# Patient Record
Sex: Male | Born: 1950 | Race: White | Hispanic: No | Smoking: Never smoker
Health system: Southern US, Academic
[De-identification: ages and names within clinical notes are randomized; demographics above are authoritative.]

## PROBLEM LIST (undated history)

## (undated) DIAGNOSIS — F329 Major depressive disorder, single episode, unspecified: Secondary | ICD-10-CM

## (undated) DIAGNOSIS — I10 Essential (primary) hypertension: Secondary | ICD-10-CM

## (undated) DIAGNOSIS — Z8719 Personal history of other diseases of the digestive system: Secondary | ICD-10-CM

## (undated) DIAGNOSIS — Z973 Presence of spectacles and contact lenses: Secondary | ICD-10-CM

## (undated) DIAGNOSIS — H409 Unspecified glaucoma: Secondary | ICD-10-CM

## (undated) DIAGNOSIS — K5790 Diverticulosis of intestine, part unspecified, without perforation or abscess without bleeding: Secondary | ICD-10-CM

## (undated) DIAGNOSIS — K219 Gastro-esophageal reflux disease without esophagitis: Secondary | ICD-10-CM

## (undated) DIAGNOSIS — M1990 Unspecified osteoarthritis, unspecified site: Secondary | ICD-10-CM

## (undated) DIAGNOSIS — R7303 Prediabetes: Secondary | ICD-10-CM

## (undated) DIAGNOSIS — C801 Malignant (primary) neoplasm, unspecified: Secondary | ICD-10-CM

## (undated) DIAGNOSIS — F32A Depression, unspecified: Secondary | ICD-10-CM

## (undated) DIAGNOSIS — E7800 Pure hypercholesterolemia, unspecified: Secondary | ICD-10-CM

## (undated) DIAGNOSIS — M75100 Unspecified rotator cuff tear or rupture of unspecified shoulder, not specified as traumatic: Secondary | ICD-10-CM

## (undated) DIAGNOSIS — R011 Cardiac murmur, unspecified: Secondary | ICD-10-CM

## (undated) DIAGNOSIS — F419 Anxiety disorder, unspecified: Secondary | ICD-10-CM

## (undated) HISTORY — PX: ELBOW SURGERY: SHX618

## (undated) HISTORY — PX: CARPAL TUNNEL RELEASE: SHX101

## (undated) HISTORY — PX: WISDOM TOOTH EXTRACTION: SHX21

## (undated) HISTORY — PX: ANKLE SURGERY: SHX546

## (undated) HISTORY — PX: COLONOSCOPY: SHX174

---

## 2015-05-04 ENCOUNTER — Other Ambulatory Visit: Payer: Self-pay | Admitting: Orthopedic Surgery

## 2015-05-04 DIAGNOSIS — M545 Low back pain: Secondary | ICD-10-CM

## 2015-05-04 DIAGNOSIS — M25511 Pain in right shoulder: Secondary | ICD-10-CM

## 2015-05-18 ENCOUNTER — Ambulatory Visit
Admission: RE | Admit: 2015-05-18 | Discharge: 2015-05-18 | Disposition: A | Discharge status: Home / Self Care | Payer: Commercial Managed Care - PPO | Source: Ambulatory Visit | Attending: Orthopedic Surgery | Admitting: Orthopedic Surgery

## 2015-05-18 ENCOUNTER — Ambulatory Visit
Admission: RE | Admit: 2015-05-18 | Discharge: 2015-05-18 | Disposition: A | Discharge status: Home / Self Care | Payer: Self-pay | Source: Ambulatory Visit | Attending: Orthopedic Surgery | Admitting: Orthopedic Surgery

## 2015-05-18 DIAGNOSIS — M545 Low back pain: Secondary | ICD-10-CM

## 2015-05-18 DIAGNOSIS — M25511 Pain in right shoulder: Secondary | ICD-10-CM

## 2015-05-18 MED ORDER — IOHEXOL 180 MG/ML  SOLN
15.0000 mL | Freq: Once | INTRAMUSCULAR | Status: AC | PRN
  Administered 2015-05-18: 15 mL via INTRA_ARTICULAR

## 2015-05-21 ENCOUNTER — Other Ambulatory Visit (HOSPITAL_COMMUNITY): Payer: Self-pay | Admitting: Orthopedic Surgery

## 2015-05-28 ENCOUNTER — Encounter (HOSPITAL_COMMUNITY): Payer: Self-pay

## 2015-05-28 ENCOUNTER — Encounter (HOSPITAL_COMMUNITY)
Admission: RE | Admit: 2015-05-28 | Discharge: 2015-05-28 | Disposition: A | Discharge status: Home / Self Care | Payer: Commercial Managed Care - PPO | Source: Ambulatory Visit | Attending: Orthopedic Surgery | Admitting: Orthopedic Surgery

## 2015-05-28 ENCOUNTER — Other Ambulatory Visit (HOSPITAL_COMMUNITY): Payer: Self-pay | Admitting: Orthopedic Surgery

## 2015-05-28 DIAGNOSIS — I10 Essential (primary) hypertension: Secondary | ICD-10-CM | POA: Diagnosis not present

## 2015-05-28 DIAGNOSIS — M75101 Unspecified rotator cuff tear or rupture of right shoulder, not specified as traumatic: Secondary | ICD-10-CM | POA: Diagnosis not present

## 2015-05-28 DIAGNOSIS — F17290 Nicotine dependence, other tobacco product, uncomplicated: Secondary | ICD-10-CM | POA: Diagnosis not present

## 2015-05-28 DIAGNOSIS — M7551 Bursitis of right shoulder: Secondary | ICD-10-CM | POA: Diagnosis not present

## 2015-05-28 HISTORY — DX: Pure hypercholesterolemia, unspecified: E78.00

## 2015-05-28 HISTORY — DX: Presence of spectacles and contact lenses: Z97.3

## 2015-05-28 HISTORY — DX: Personal history of other diseases of the digestive system: Z87.19

## 2015-05-28 HISTORY — DX: Diverticulosis of intestine, part unspecified, without perforation or abscess without bleeding: K57.90

## 2015-05-28 HISTORY — DX: Essential (primary) hypertension: I10

## 2015-05-28 HISTORY — DX: Unspecified rotator cuff tear or rupture of unspecified shoulder, not specified as traumatic: M75.100

## 2015-05-28 HISTORY — DX: Major depressive disorder, single episode, unspecified: F32.9

## 2015-05-28 HISTORY — DX: Cardiac murmur, unspecified: R01.1

## 2015-05-28 HISTORY — DX: Depression, unspecified: F32.A

## 2015-05-28 HISTORY — DX: Unspecified glaucoma: H40.9

## 2015-05-28 HISTORY — DX: Gastro-esophageal reflux disease without esophagitis: K21.9

## 2015-05-28 HISTORY — DX: Malignant (primary) neoplasm, unspecified: C80.1

## 2015-05-28 HISTORY — DX: Anxiety disorder, unspecified: F41.9

## 2015-05-28 HISTORY — DX: Unspecified osteoarthritis, unspecified site: M19.90

## 2015-05-28 LAB — COMPREHENSIVE METABOLIC PANEL WITH GFR
ALT: 38 U/L (ref 17–63)
AST: 27 U/L (ref 15–41)
Albumin: 4 g/dL (ref 3.5–5.0)
Alkaline Phosphatase: 55 U/L (ref 38–126)
Anion gap: 10 (ref 5–15)
BUN: 18 mg/dL (ref 6–20)
CO2: 26 mmol/L (ref 22–32)
Calcium: 9.8 mg/dL (ref 8.9–10.3)
Chloride: 99 mmol/L — ABNORMAL LOW (ref 101–111)
Creatinine, Ser: 1.08 mg/dL (ref 0.61–1.24)
GFR calc Af Amer: >60 mL/min
GFR calc non Af Amer: >60 mL/min
Glucose, Bld: 100 mg/dL — ABNORMAL HIGH (ref 65–99)
Potassium: 4.4 mmol/L (ref 3.5–5.1)
Sodium: 135 mmol/L (ref 135–145)
Total Bilirubin: 0.6 mg/dL (ref 0.3–1.2)
Total Protein: 7.1 g/dL (ref 6.5–8.1)

## 2015-05-28 LAB — CBC
HCT: 41.8 % (ref 39.0–52.0)
Hemoglobin: 14.5 g/dL (ref 13.0–17.0)
MCH: 31.6 pg (ref 26.0–34.0)
MCHC: 34.7 g/dL (ref 30.0–36.0)
MCV: 91.1 fL (ref 78.0–100.0)
Platelets: 272 10*3/uL (ref 150–400)
RBC: 4.59 MIL/uL (ref 4.22–5.81)
RDW: 12.9 % (ref 11.5–15.5)
WBC: 6.4 10*3/uL (ref 4.0–10.5)

## 2015-05-28 MED ORDER — CHLORHEXIDINE GLUCONATE 4 % EX LIQD: 60.0000 mL | Freq: Once | CUTANEOUS | Status: DC

## 2015-05-28 MED ORDER — CEFAZOLIN SODIUM-DEXTROSE 2-3 GM-% IV SOLR
2.0000 g | INTRAVENOUS | Status: AC
  Administered 2015-05-31: 2 g via INTRAVENOUS
  Filled 2015-05-28: qty 50

## 2015-05-28 NOTE — Progress Notes (Signed)
   05/28/15 1523  OBSTRUCTIVE SLEEP APNEA  Have you ever been diagnosed with sleep apnea through a sleep study? No  Do you snore loudly (loud enough to be heard through closed doors)?  1  Do you often feel tired, fatigued, or sleepy during the daytime (such as falling asleep during driving or talking to someone)? 0  Has anyone observed you stop breathing during your sleep? 0  Do you have, or are you being treated for high blood pressure? 1  BMI more than 35 kg/m2? 1  Age > 50 (1-yes) 1  Neck circumference greater than:Male 16 inches or larger, Male 17inches or larger? 1  Male Gender (Yes=1) 1  Obstructive Sleep Apnea Score 6

## 2015-05-28 NOTE — Progress Notes (Signed)
Pt denies SOB, chest pain, and being under the care of a cardiologist. Pt denies having an echo and cardiac cath but stated that a stress test was done in 2005 in [place] , [place]; pt cannot recall the name of the medical practice or the physician that completed the echo. Ebony Hail, PA, anesthesia, made aware that pt drinks 2 glasses of wine daily while consuming Celebrex; Ebony Hail, [place], advised that it is okay to draw a CMET. Requested EKG from Dr. Tilden Dome at [facility] since tracing is not displayed, only results of EKG.

## 2015-05-28 NOTE — H&P (Signed)
Dale Glover. is an 65 y.o. male.   Chief Complaint: Right shoulder pain HPI: Dale Glover is a 65 year old patient with right shoulder pain. Has had shoulder pain for several months benign October was reaching for showerhead felt a pop and tear and shoulders been having some pain and weakness since that time. He is failed nonoperative management. Reports pain with ADLs as well as significant weakness particularly when he trying to raise his arm up overhead. He denies any neck pain or radicular symptoms MRI scan has been performed and show review mildly retracted rotator cuff tear of the supraspinatus  Past Medical History  Diagnosis Date  . Depression   . Hypercholesterolemia   . Arthritis   . Rotator cuff tear     right  . Hypertension   . GERD (gastroesophageal reflux disease)   . H/O: GI bleed   . Glaucoma   . Wears glasses   . Heart murmur   . Anxiety   . Cancer ([place])     basal cell carcinoma of the skin  . Diverticulosis     Past Surgical History  Procedure Laterality Date  . Carpal tunnel release      right wrist  . Colonoscopy    . Wisdom tooth extraction      Family History  Problem Relation Age of Onset  . Hypertension Mother   . Glaucoma Mother   . Hypertension Father   . Glaucoma Father   . Hypertension Sister    Social History:  reports that he has been smoking Cigars.  He has never used smokeless tobacco. He reports that he drinks alcohol. He reports that he does not use illicit drugs.  Allergies:  Allergies  Allergen Reactions  . Loratadine Other (See Comments)    Unknown  . Statins Other (See Comments)    Muscle aches  . Erythromycin Rash    No prescriptions prior to admission    Results for orders placed or performed during the hospital encounter of 05/28/15 (from the past 48 hour(s))  CBC     Status: None   Collection Time: 05/28/15  3:46 PM  Result Value Ref Range   WBC 6.4 4.0 - 10.5 K/uL   RBC 4.59 4.22 - 5.81 MIL/uL   Hemoglobin  14.5 13.0 - 17.0 g/dL   HCT 41.8 39.0 - 52.0 %   MCV 91.1 78.0 - 100.0 fL   MCH 31.6 26.0 - 34.0 pg   MCHC 34.7 30.0 - 36.0 g/dL   RDW 12.9 11.5 - 15.5 %   Platelets 272 150 - 400 K/uL  Comprehensive metabolic panel     Status: Abnormal   Collection Time: 05/28/15  3:46 PM  Result Value Ref Range   Sodium 135 135 - 145 mmol/L   Potassium 4.4 3.5 - 5.1 mmol/L   Chloride 99 (L) 101 - 111 mmol/L   CO2 26 22 - 32 mmol/L   Glucose, Bld 100 (H) 65 - 99 mg/dL   BUN 18 6 - 20 mg/dL   Creatinine, Ser 1.08 0.61 - 1.24 mg/dL   Calcium 9.8 8.9 - 10.3 mg/dL   Total Protein 7.1 6.5 - 8.1 g/dL   Albumin 4.0 3.5 - 5.0 g/dL   AST 27 15 - 41 U/L   ALT 38 17 - 63 U/L   Alkaline Phosphatase 55 38 - 126 U/L   Total Bilirubin 0.6 0.3 - 1.2 mg/dL   GFR calc non Af Amer >60 >60 mL/min   GFR calc  Af Amer >60 >60 mL/min    Comment: (NOTE) The eGFR has been calculated using the CKD EPI equation. This calculation has not been validated in all clinical situations. eGFR's persistently <60 mL/min signify possible Chronic Kidney Disease.    Anion gap 10 5 - 15   No results found.  Review of Systems  Constitutional: Negative.   HENT: Negative.   Eyes: Negative.   Respiratory: Negative.   Cardiovascular: Negative.   Gastrointestinal: Negative.   Genitourinary: Negative.   Musculoskeletal: Positive for joint pain.  Skin: Negative.   Neurological: Negative.   Endo/Heme/Allergies: Negative.   Psychiatric/Behavioral: Negative.     There were no vitals taken for this visit. Physical Exam  Constitutional: He appears well-developed.  HENT:  Head: Normocephalic.  Eyes: Pupils are equal, round, and reactive to light.  Neck: Normal range of motion.  Cardiovascular: Normal rate.   Respiratory: Effort normal.  Neurological: He is alert.  Skin: Skin is warm.  Psychiatric: He has a normal mood and affect.   examination of the right shoulder demonstrates supraspinatus and infraspinatus weakness no  before meals joint tenderness direct palpation elbow of coarse grinding with passive range of motion of the shoulder positive impingement signs equivocal O'Brien's testing no other masses lymphadenopathy or skin changes noted in the right shoulder girdle region  Assessment/Plan Impression is right shoulder rotator cuff tear which is symptomatic in this 65 year old patient. He is failed course of conservative treatment. Symptoms and weakness are persisting and worsening slightly. He is a lot of functional disability with the arm. MRI scan does show retracted tear of the supraspinatus but only about 2 cm. There is no muscle atrophy in the supraspinatus muscle belly. Status of the biceps tendon and anchor is not definitely known but looks reasonably good on my review of the scan. He does have some widening of the before meals joint but no instability and no symptoms clinically in this area plan is for arthroscopy inspection of the biceps tendon subacromial decompression with preservation of the CA ligament mini open rotator cuff repair which if we can get a stable repair can we can start him on CPM machine postop day #1 area risk and benefits discussed with the patient including not limited to infection nerve vessel damage shoulder stiffness as well as fairly prolonged recovery. Anticipate outpatient procedure. Will use CPM machine postop. All questions answered  Anayansi Rundquist SCOTT 05/28/2015, 9:51 PM

## 2015-05-28 NOTE — Pre-Procedure Instructions (Signed)
Dale Glover.  05/28/2015      [facility] - [address], [place] [address] Phone: [phone] Fax: [phone]  [facility] [place], [place] - [place] [address] Phone: [phone] Fax: [phone]    Your procedure is scheduled on Monday, May 31, 2015  Report to [facility] Admitting at 5:30 A.M.  Call this number if you have problems the morning of surgery:  [phone]   Remember:  Do not eat food or drink liquids after midnight Sunday, May 30, 2015  Take these medicines the morning of surgery with A SIP OF WATER : omeprazole (PRILOSEC) ,  sertraline (ZOLOFT), dorzolamide-timolol (COSOPT) eye drops  Stop taking Aspirin, vitamins, and herbal medications, omega-3 acid ethyl esters (LOVAZA)  . Do not take any NSAIDs ie: Ibuprofen, Advil, Naproxen or any medication containing Aspirin such ascelecoxib (CELEBREX) ; stop now.  Do not wear jewelry, make-up or nail polish.  Do not wear lotions, powders, or perfumes.  You may not wear deodorant.  Do not shave 48 hours prior to surgery.  Men may shave face and neck.  Do not bring valuables to the hospital.  [facility] is not responsible for any belongings or valuables.  Contacts, dentures or bridgework may not be worn into surgery.  Leave your suitcase in the car.  After surgery it may be brought to your room.  For patients admitted to the hospital, discharge time will be determined by your treatment team.  Patients discharged the day of surgery will not be allowed to drive home.   Name and phone number of your driver:   Special instructions:  Special Instructions:Special Instructions: [facility] - Preparing for Surgery  Before surgery, you can play an important role.  Because skin is not sterile, your skin needs to be as free of germs as possible.  You can reduce the number  of germs on you skin by washing with CHG (chlorahexidine gluconate) soap before surgery.  CHG is an antiseptic cleaner which kills germs and bonds with the skin to continue killing germs even after washing.  Please DO NOT use if you have an allergy to CHG or antibacterial soaps.  If your skin becomes reddened/irritated stop using the CHG and inform your nurse when you arrive at Short Stay.  Do not shave (including legs and underarms) for at least 48 hours prior to the first CHG shower.  You may shave your face.  Please follow these instructions carefully:   1.  Shower with CHG Soap the night before surgery and the morning of Surgery.  2.  If you choose to wash your hair, wash your hair first as usual with your normal shampoo.  3.  After you shampoo, rinse your hair and body thoroughly to remove the Shampoo.  4.  Use CHG as you would any other liquid soap.  You can apply chg directly  to the skin and wash gently with scrungie or a clean washcloth.  5.  Apply the CHG Soap to your body ONLY FROM THE NECK DOWN.  Do not use on open wounds or open sores.  Avoid contact with your eyes, ears, mouth and genitals (private parts).  Wash genitals (private parts) with your normal soap.  6.  Wash thoroughly, paying special attention to the area where your surgery will be performed.  7.  Thoroughly  rinse your body with warm water from the neck down.  8.  DO NOT shower/wash with your normal soap after using and rinsing off the CHG Soap.  9.  Pat yourself dry with a clean towel.            10.  Wear clean pajamas.            11.  Place clean sheets on your bed the night of your first shower and do not sleep with pets.  Day of Surgery  Do not apply any lotions/deodorants the morning of surgery.  Please wear clean clothes to the hospital/surgery center.  Please read over the following fact sheets that you were given. Pain Booklet, Coughing and Deep Breathing and Surgical Site Infection Prevention

## 2015-05-30 NOTE — Anesthesia Preprocedure Evaluation (Addendum)
Anesthesia Evaluation  Patient identified by MRN, date of birth, ID band Patient awake    Reviewed: Allergy & Precautions, NPO status , Patient's Chart, lab work & pertinent test results  Airway Mallampati: III  TM Distance: >3 FB Neck ROM: Full    Dental  (+) Teeth Intact, Dental Advisory Given   Pulmonary Current Smoker,    Pulmonary exam normal breath sounds clear to auscultation       Cardiovascular Exercise Tolerance: Good hypertension, Pt. on medications (-) angina(-) CAD and (-) Past MI Normal cardiovascular exam Rhythm:Regular Rate:Normal     Neuro/Psych PSYCHIATRIC DISORDERS Anxiety Depression negative neurological ROS     GI/Hepatic Neg liver ROS, GERD  Medicated,  Endo/Other  Obesity   Renal/GU negative Renal ROS     Musculoskeletal  (+) Arthritis , Osteoarthritis,    Abdominal   Peds  Hematology negative hematology ROS (+)   Anesthesia Other Findings Day of surgery medications reviewed with the patient.  Reproductive/Obstetrics                           Anesthesia Physical Anesthesia Plan  ASA: II  Anesthesia Plan: General   Post-op Pain Management: GA combined w/ Regional for post-op pain   Induction: Intravenous  Airway Management Planned: Oral ETT  Additional Equipment:   Intra-op Plan:   Post-operative Plan: Extubation in OR  Informed Consent: I have reviewed the patients History and Physical, chart, labs and discussed the procedure including the risks, benefits and alternatives for the proposed anesthesia with the patient or authorized representative who has indicated his/her understanding and acceptance.   Dental advisory given  Plan Discussed with: CRNA  Anesthesia Plan Comments: (Risks/benefits of general anesthesia discussed with patient including risk of damage to teeth, lips, gum, and tongue, nausea/vomiting, allergic reactions to medications, and the  possibility of heart attack, stroke and death.  All patient questions answered.  Patient wishes to proceed. Discussed risks and benefits of interscalene block including failure, bleeding, infection, nerve damage, weakness, shortness of breath, pneumothorax. Questions answered. Patient consents to block.  GETA plus ISB)        Anesthesia Quick Evaluation

## 2015-05-31 ENCOUNTER — Ambulatory Visit (HOSPITAL_COMMUNITY): Payer: Commercial Managed Care - PPO | Admitting: Anesthesiology

## 2015-05-31 ENCOUNTER — Encounter (HOSPITAL_COMMUNITY): Payer: Self-pay | Admitting: *Deleted

## 2015-05-31 ENCOUNTER — Ambulatory Visit (HOSPITAL_COMMUNITY)
Admission: RE | Admit: 2015-05-31 | Discharge: 2015-05-31 | Disposition: A | Discharge status: Home / Self Care | Payer: Commercial Managed Care - PPO | Source: Ambulatory Visit | Attending: Orthopedic Surgery | Admitting: Orthopedic Surgery

## 2015-05-31 ENCOUNTER — Encounter (HOSPITAL_COMMUNITY)
Admission: RE | Disposition: A | Discharge status: Home / Self Care | Payer: Self-pay | Source: Ambulatory Visit | Attending: Orthopedic Surgery

## 2015-05-31 ENCOUNTER — Ambulatory Visit (HOSPITAL_COMMUNITY): Payer: Commercial Managed Care - PPO

## 2015-05-31 DIAGNOSIS — M7551 Bursitis of right shoulder: Secondary | ICD-10-CM | POA: Insufficient documentation

## 2015-05-31 DIAGNOSIS — M75101 Unspecified rotator cuff tear or rupture of right shoulder, not specified as traumatic: Secondary | ICD-10-CM | POA: Insufficient documentation

## 2015-05-31 DIAGNOSIS — Z419 Encounter for procedure for purposes other than remedying health state, unspecified: Secondary | ICD-10-CM

## 2015-05-31 DIAGNOSIS — I10 Essential (primary) hypertension: Secondary | ICD-10-CM | POA: Insufficient documentation

## 2015-05-31 DIAGNOSIS — F17290 Nicotine dependence, other tobacco product, uncomplicated: Secondary | ICD-10-CM | POA: Insufficient documentation

## 2015-05-31 HISTORY — PX: SHOULDER ARTHROSCOPY WITH OPEN ROTATOR CUFF REPAIR AND DISTAL CLAVICLE ACROMINECTOMY: SHX5683

## 2015-05-31 SURGERY — SHOULDER ARTHROSCOPY WITH OPEN ROTATOR CUFF REPAIR AND DISTAL CLAVICLE ACROMINECTOMY
Anesthesia: General | Site: Shoulder | Laterality: Right

## 2015-05-31 MED ORDER — PHENYLEPHRINE 40 MCG/ML (10ML) SYRINGE FOR IV PUSH (FOR BLOOD PRESSURE SUPPORT)
PREFILLED_SYRINGE | INTRAVENOUS | Status: AC
  Filled 2015-05-31: qty 20

## 2015-05-31 MED ORDER — EPINEPHRINE HCL 1 MG/ML IJ SOLN
INTRAMUSCULAR | Status: AC
  Filled 2015-05-31: qty 1

## 2015-05-31 MED ORDER — NEOSTIGMINE METHYLSULFATE 10 MG/10ML IV SOLN
INTRAVENOUS | Status: DC | PRN
  Administered 2015-05-31: 5 mg via INTRAVENOUS

## 2015-05-31 MED ORDER — METHOCARBAMOL 500 MG PO TABS: 500.0000 mg | ORAL_TABLET | Freq: Four times a day (QID) | ORAL | Status: DC

## 2015-05-31 MED ORDER — LACTATED RINGERS IV SOLN
INTRAVENOUS | Status: DC | PRN
  Administered 2015-05-31 (×2): via INTRAVENOUS

## 2015-05-31 MED ORDER — DEXAMETHASONE SODIUM PHOSPHATE 10 MG/ML IJ SOLN
INTRAMUSCULAR | Status: DC | PRN
  Administered 2015-05-31: 10 mg via INTRAVENOUS

## 2015-05-31 MED ORDER — EPINEPHRINE HCL 1 MG/ML IJ SOLN
INTRAMUSCULAR | Status: DC | PRN
  Administered 2015-05-31: .1 mL via INTRAMUSCULAR

## 2015-05-31 MED ORDER — ROCURONIUM BROMIDE 100 MG/10ML IV SOLN
INTRAVENOUS | Status: DC | PRN
  Administered 2015-05-31: 50 mg via INTRAVENOUS

## 2015-05-31 MED ORDER — FENTANYL CITRATE (PF) 100 MCG/2ML IJ SOLN: 25.0000 ug | INTRAMUSCULAR | Status: DC | PRN

## 2015-05-31 MED ORDER — PROPOFOL 10 MG/ML IV BOLUS
INTRAVENOUS | Status: AC
  Filled 2015-05-31: qty 20

## 2015-05-31 MED ORDER — LIDOCAINE HCL (CARDIAC) 20 MG/ML IV SOLN
INTRAVENOUS | Status: DC | PRN
  Administered 2015-05-31: 100 mg via INTRATRACHEAL

## 2015-05-31 MED ORDER — SODIUM CHLORIDE 0.9 % IJ SOLN
INTRAMUSCULAR | Status: DC | PRN
  Administered 2015-05-31 (×3): 10 mL

## 2015-05-31 MED ORDER — ARTIFICIAL TEARS OP OINT
TOPICAL_OINTMENT | OPHTHALMIC | Status: DC | PRN
  Administered 2015-05-31: 1 via OPHTHALMIC

## 2015-05-31 MED ORDER — MIDAZOLAM HCL 2 MG/2ML IJ SOLN
INTRAMUSCULAR | Status: AC
  Filled 2015-05-31: qty 2

## 2015-05-31 MED ORDER — ROCURONIUM BROMIDE 50 MG/5ML IV SOLN
INTRAVENOUS | Status: AC
  Filled 2015-05-31: qty 1

## 2015-05-31 MED ORDER — DEXAMETHASONE SODIUM PHOSPHATE 10 MG/ML IJ SOLN
INTRAMUSCULAR | Status: AC
  Filled 2015-05-31: qty 1

## 2015-05-31 MED ORDER — ARTIFICIAL TEARS OP OINT
TOPICAL_OINTMENT | OPHTHALMIC | Status: AC
  Filled 2015-05-31: qty 3.5

## 2015-05-31 MED ORDER — SODIUM CHLORIDE 0.9 % IR SOLN
Status: DC | PRN
  Administered 2015-05-31 (×2): 3000 mL

## 2015-05-31 MED ORDER — PROPOFOL 10 MG/ML IV BOLUS
INTRAVENOUS | Status: DC | PRN
  Administered 2015-05-31: 150 mg via INTRAVENOUS

## 2015-05-31 MED ORDER — EPHEDRINE SULFATE 50 MG/ML IJ SOLN
INTRAMUSCULAR | Status: AC
  Filled 2015-05-31: qty 1

## 2015-05-31 MED ORDER — BUPIVACAINE HCL (PF) 0.25 % IJ SOLN
INTRAMUSCULAR | Status: DC | PRN
  Administered 2015-05-31: 30 mL

## 2015-05-31 MED ORDER — 0.9 % SODIUM CHLORIDE (POUR BTL) OPTIME
TOPICAL | Status: DC | PRN
  Administered 2015-05-31: 1000 mL

## 2015-05-31 MED ORDER — FENTANYL CITRATE (PF) 250 MCG/5ML IJ SOLN
INTRAMUSCULAR | Status: DC | PRN
  Administered 2015-05-31 (×2): 50 ug via INTRAVENOUS
  Administered 2015-05-31: 100 ug via INTRAVENOUS
  Administered 2015-05-31: 50 ug via INTRAVENOUS

## 2015-05-31 MED ORDER — ONDANSETRON HCL 4 MG/2ML IJ SOLN
INTRAMUSCULAR | Status: DC | PRN
  Administered 2015-05-31: 4 mg via INTRAVENOUS

## 2015-05-31 MED ORDER — MIDAZOLAM HCL 2 MG/2ML IJ SOLN
INTRAMUSCULAR | Status: DC | PRN
  Administered 2015-05-31: 2 mg via INTRAVENOUS

## 2015-05-31 MED ORDER — GLYCOPYRROLATE 0.2 MG/ML IJ SOLN
INTRAMUSCULAR | Status: DC | PRN
  Administered 2015-05-31: .8 mg via INTRAVENOUS
  Administered 2015-05-31: 0.2 mg via INTRAVENOUS

## 2015-05-31 MED ORDER — ONDANSETRON HCL 4 MG/2ML IJ SOLN
INTRAMUSCULAR | Status: AC
  Filled 2015-05-31: qty 2

## 2015-05-31 MED ORDER — BUPIVACAINE HCL (PF) 0.25 % IJ SOLN
INTRAMUSCULAR | Status: AC
  Filled 2015-05-31: qty 30

## 2015-05-31 MED ORDER — HYDROCODONE-ACETAMINOPHEN 5-325 MG PO TABS: 1.0000 | ORAL_TABLET | Freq: Four times a day (QID) | ORAL | Status: DC | PRN

## 2015-05-31 MED ORDER — PHENYLEPHRINE HCL 10 MG/ML IJ SOLN
INTRAMUSCULAR | Status: DC | PRN
  Administered 2015-05-31: 120 ug via INTRAVENOUS
  Administered 2015-05-31 (×2): 160 ug via INTRAVENOUS
  Administered 2015-05-31: 120 ug via INTRAVENOUS
  Administered 2015-05-31: 80 ug via INTRAVENOUS

## 2015-05-31 MED ORDER — LIDOCAINE HCL (CARDIAC) 20 MG/ML IV SOLN
INTRAVENOUS | Status: AC
  Filled 2015-05-31: qty 5

## 2015-05-31 MED ORDER — BUPIVACAINE-EPINEPHRINE (PF) 0.5% -1:200000 IJ SOLN
INTRAMUSCULAR | Status: DC | PRN
  Administered 2015-05-31: 20 mL via PERINEURAL

## 2015-05-31 MED ORDER — PHENYLEPHRINE HCL 10 MG/ML IJ SOLN
10.0000 mg | INTRAMUSCULAR | Status: DC | PRN
  Administered 2015-05-31: 50 ug/min via INTRAVENOUS

## 2015-05-31 MED ORDER — ONDANSETRON HCL 4 MG/2ML IJ SOLN: 4.0000 mg | Freq: Once | INTRAMUSCULAR | Status: DC | PRN

## 2015-05-31 MED ORDER — METOPROLOL TARTRATE 1 MG/ML IV SOLN
INTRAVENOUS | Status: DC | PRN
  Administered 2015-05-31: 1 mg via INTRAVENOUS

## 2015-05-31 MED ORDER — FENTANYL CITRATE (PF) 250 MCG/5ML IJ SOLN
INTRAMUSCULAR | Status: AC
  Filled 2015-05-31: qty 5

## 2015-05-31 MED ORDER — EPHEDRINE SULFATE 50 MG/ML IJ SOLN
INTRAMUSCULAR | Status: DC | PRN
  Administered 2015-05-31: 15 mg via INTRAVENOUS
  Administered 2015-05-31 (×2): 5 mg via INTRAVENOUS
  Administered 2015-05-31: 10 mg via INTRAVENOUS
  Administered 2015-05-31: 15 mg via INTRAVENOUS

## 2015-05-31 MED ORDER — GLYCOPYRROLATE 0.2 MG/ML IJ SOLN
INTRAMUSCULAR | Status: AC
  Filled 2015-05-31: qty 1

## 2015-05-31 SURGICAL SUPPLY — 68 items
ANCHOR CORKSCREW BIO 5.5 FT (Anchor) ×6 IMPLANT
ANCHOR CORKSCREW BIO 6.5 W/SUT (Anchor) IMPLANT
BLADE CUTTER GATOR 3.5 (BLADE) ×3 IMPLANT
BLADE GREAT WHITE 4.2 (BLADE) ×2 IMPLANT
BLADE GREAT WHITE 4.2MM (BLADE) ×1
BLADE SURG 11 STRL SS (BLADE) ×3 IMPLANT
BUR OVAL 6.0 (BURR) ×3 IMPLANT
CLOSURE STERI-STRIP 1/2X4 (GAUZE/BANDAGES/DRESSINGS) ×1
CLOSURE WOUND 1/2 X4 (GAUZE/BANDAGES/DRESSINGS) ×1
CLSR STERI-STRIP ANTIMIC 1/2X4 (GAUZE/BANDAGES/DRESSINGS) ×2 IMPLANT
COVER SURGICAL LIGHT HANDLE (MISCELLANEOUS) ×3 IMPLANT
DRAPE INCISE IOBAN 66X45 STRL (DRAPES) ×6 IMPLANT
DRAPE STERI 35X30 U-POUCH (DRAPES) ×3 IMPLANT
DRAPE U-SHAPE 47X51 STRL (DRAPES) ×6 IMPLANT
DRSG MEPILEX BORDER 4X8 (GAUZE/BANDAGES/DRESSINGS) ×3 IMPLANT
DRSG PAD ABDOMINAL 8X10 ST (GAUZE/BANDAGES/DRESSINGS) ×9 IMPLANT
DURAPREP 26ML APPLICATOR (WOUND CARE) ×3 IMPLANT
ELECT REM PT RETURN 9FT ADLT (ELECTROSURGICAL) ×3
ELECTRODE REM PT RTRN 9FT ADLT (ELECTROSURGICAL) ×1 IMPLANT
FILTER STRAW FLUID ASPIR (MISCELLANEOUS) ×3 IMPLANT
GLOVE BIOGEL PI IND STRL 8 (GLOVE) ×1 IMPLANT
GLOVE BIOGEL PI INDICATOR 8 (GLOVE) ×2
GLOVE SURG ORTHO 8.0 STRL STRW (GLOVE) ×3 IMPLANT
GOWN STRL REUS W/ TWL LRG LVL3 (GOWN DISPOSABLE) ×3 IMPLANT
GOWN STRL REUS W/TWL LRG LVL3 (GOWN DISPOSABLE) ×6
KIT BASIN OR (CUSTOM PROCEDURE TRAY) ×3 IMPLANT
KIT ROOM TURNOVER OR (KITS) ×3 IMPLANT
MANIFOLD NEPTUNE II (INSTRUMENTS) ×3 IMPLANT
NDL SUT 6 .5 CRC .975X.05 MAYO (NEEDLE) ×1 IMPLANT
NEEDLE HYPO 25X1 1.5 SAFETY (NEEDLE) ×3 IMPLANT
NEEDLE MAYO TAPER (NEEDLE) ×2
NEEDLE SCORPION MULTI FIRE (NEEDLE) ×3 IMPLANT
NEEDLE SPNL 18GX3.5 QUINCKE PK (NEEDLE) ×3 IMPLANT
NS IRRIG 1000ML POUR BTL (IV SOLUTION) ×3 IMPLANT
PACK SHOULDER (CUSTOM PROCEDURE TRAY) ×3 IMPLANT
PAD ARMBOARD 7.5X6 YLW CONV (MISCELLANEOUS) ×6 IMPLANT
PUSHLOCK PEEK 4.5X24 (Orthopedic Implant) ×6 IMPLANT
RESTRAINT HEAD UNIVERSAL NS (MISCELLANEOUS) ×3 IMPLANT
SET ARTHROSCOPY TUBING (MISCELLANEOUS) ×2
SET ARTHROSCOPY TUBING LN (MISCELLANEOUS) ×1 IMPLANT
SLING ARM IMMOBILIZER LRG (SOFTGOODS) ×3 IMPLANT
SLING ARM IMMOBILIZER MED (SOFTGOODS) IMPLANT
SPONGE GAUZE 4X4 12PLY STER LF (GAUZE/BANDAGES/DRESSINGS) ×3 IMPLANT
SPONGE LAP 4X18 X RAY DECT (DISPOSABLE) ×6 IMPLANT
STRIP CLOSURE SKIN 1/2X4 (GAUZE/BANDAGES/DRESSINGS) ×2 IMPLANT
SUCTION FRAZIER TIP 10 FR DISP (SUCTIONS) ×3 IMPLANT
SUT ETHILON 3 0 PS 1 (SUTURE) ×3 IMPLANT
SUT FIBERWIRE #2 38 T-5 BLUE (SUTURE)
SUT FIBERWIRE 2-0 18 17.9 3/8 (SUTURE) ×21
SUT MNCRL AB 3-0 PS2 18 (SUTURE) ×3 IMPLANT
SUT VIC AB 0 CT1 27 (SUTURE) ×6
SUT VIC AB 0 CT1 27XBRD ANBCTR (SUTURE) ×3 IMPLANT
SUT VIC AB 1 CT1 27 (SUTURE) ×2
SUT VIC AB 1 CT1 27XBRD ANBCTR (SUTURE) ×1 IMPLANT
SUT VIC AB 2-0 CT1 27 (SUTURE) ×2
SUT VIC AB 2-0 CT1 TAPERPNT 27 (SUTURE) ×1 IMPLANT
SUT VIC AB 3-0 FS2 27 (SUTURE) ×3 IMPLANT
SUT VICRYL 0 UR6 27IN ABS (SUTURE) ×6 IMPLANT
SUTURE FIBERWR #2 38 T-5 BLUE (SUTURE) IMPLANT
SUTURE FIBERWR 2-0 18 17.9 3/8 (SUTURE) ×7 IMPLANT
SYR 20CC LL (SYRINGE) ×6 IMPLANT
SYR 3ML LL SCALE MARK (SYRINGE) ×3 IMPLANT
SYR TB 1ML LUER SLIP (SYRINGE) ×3 IMPLANT
TOWEL OR 17X24 6PK STRL BLUE (TOWEL DISPOSABLE) ×3 IMPLANT
TOWEL OR 17X26 10 PK STRL BLUE (TOWEL DISPOSABLE) ×3 IMPLANT
WAND HAND CNTRL MULTIVAC 90 (MISCELLANEOUS) ×3 IMPLANT
WATER STERILE IRR 1000ML POUR (IV SOLUTION) ×3 IMPLANT
YANKAUER SUCT BULB TIP NO VENT (SUCTIONS) ×3 IMPLANT

## 2015-05-31 NOTE — Interval H&P Note (Signed)
History and Physical Interval Note:  05/31/2015 7:27 AM  Dale Glover.  has presented today for surgery, with the diagnosis of RIGHT SHOULDER ROTATOR CUFF TEAR   The various methods of treatment have been discussed with the patient and family. After consideration of risks, benefits and other options for treatment, the patient has consented to  Procedure(s): RIGHT SHOULDER ARTHROSCOPY WITH MINI-OPEN ROTATOR CUFF REPAIR AND SUBACROMIAL DECOMPRESSION (Right) as a surgical intervention .  The patient's history has been reviewed, patient examined, no change in status, stable for surgery.  I have reviewed the patient's chart and labs.  Questions were answered to the patient's satisfaction.     Marvyn Torrez SCOTT

## 2015-05-31 NOTE — Anesthesia Postprocedure Evaluation (Signed)
Anesthesia Post Note  Patient: Dale Glover.  Procedure(s) Performed: Procedure(s) (LRB): RIGHT SHOULDER ARTHROSCOPY WITH MINI-OPEN ROTATOR CUFF REPAIR AND SUBACROMIAL DECOMPRESSION (Right)  Patient location during evaluation: PACU Anesthesia Type: General Level of consciousness: awake and alert Pain management: pain level controlled Vital Signs Assessment: post-procedure vital signs reviewed and stable Respiratory status: spontaneous breathing, nonlabored ventilation, respiratory function stable and patient connected to nasal cannula oxygen Cardiovascular status: blood pressure returned to baseline and stable Postop Assessment: no signs of nausea or vomiting Anesthetic complications: no    Last Vitals:  Filed Vitals:   05/31/15 1148 05/31/15 1203  BP: 131/80 135/82  Pulse: 79 78  Temp:    Resp: 12 15    Last Pain:  Filed Vitals:   05/31/15 1208  PainSc: 0-No pain                 [name],JAMES TERRILL

## 2015-05-31 NOTE — Op Note (Signed)
NAME:  Dale Glover, Dale Glover NO.:  [id number]  MEDICAL RECORD NO.:  [id number]  LOCATION:  [facility]                         FACILITY:  [facility]  PHYSICIAN:  Anderson Malta, M.D.    DATE OF BIRTH:  04-04-1951  DATE OF PROCEDURE: DATE OF DISCHARGE:  05/31/2015                              OPERATIVE REPORT   PREOPERATIVE DIAGNOSIS:  Right shoulder rotator cuff tear and bursitis.  POSTOPERATIVE DIAGNOSIS:  Right shoulder rotator cuff tear and bursitis.  PROCEDURE:  Right shoulder arthroscopy, subacromial decompression, mini open rotator cuff repair.  SURGEON:  Anderson Malta, M.D.  ASSISTANT:  Johnell Comings, RNFA  INDICATIONS:  Dale Glover is a 65 year old patient with right arm weakness, pain, rotator cuff tearing, presents for operative management after explanation of risks and benefits.  PROCEDURE IN DETAIL:  The patient was brought to the operating room where general endotracheal anesthesia was induced.  Preoperative antibiotics were administered.  Time-out was called.  The patient was placed in the beach-chair position with the head in neutral position. Right arm was prescribed with alcohol and bedtime and allowed to air dry, prepped with DuraPrep solution and draped in a sterile manner. Charlie Pitter was used to cover the axilla.  Examination under anesthesia demonstrated full forward flexion, abduction at 100 degrees, external rotation at 15 degrees with abduction on the right to about 60 degrees. Shoulder stability excellent, anterior-posterior stress.  Less than 1 cm sulcus sign.  Following examination under anesthesia, time-out was called.  Solution of saline with epinephrine was injected in the subacromial space.  Solution of saline only injected into the joint. Diagnostic arthroscopy was performed.  Posterior portal was created 2 cm medial and inferior to the posterolateral margin of the acromion. Diagnostic arthroscopy demonstrated stable biceps anchor with little  bit of early synovitis present within the rotator interval.  Biceps tendon itself was stable and intact.  The patient had a split-type tear of the infraspinatus and supraspinatus tendon, which was full thickness.  This was observed from the articular surface.  No real debridement was necessary inside the joint.  The scope was placed in the subacromial space.  Lateral portal created under direct visualization. Acromioplasty performed.  The split component of the tear was visualized.  At this time, instruments were removed.  Portals were closed using 3-0 nylon.  Charlie Pitter was used to cover the entire operative field.  Incision was made off the anterolateral margin of the acromion. Skin and subcutaneous tissue were sharply divided.  Deltoid was split and measured distance of 4 cm from the anterolateral margin of the acromion, marked with #1 Vicryl suture.  Self-retaining retractor was placed.  The rotator cuff tear was then visualized.  Essentially, there was bald spot just lateral to the supraspinatus attachment; however, about half of the supraspinatus attachment was intact anterior to this bald spot on the footprint.  What was present was delaminated tear of the infraspinatus, which had retracted posteriorly.  This was mobilized anteriorly and actually needed to be present in this bald spot on the rotator cuff attachment.  There was a split-type thickness tear in the posterior aspect of that part of the supraspinatus, which was intact.  The L-shape flap of infraspinatus and supraspinatus, which delaminated, fit into this bald spot.  This was mobilized with a Cobb.  At this time, using Scorpion suture passer, the anterior limb of the L-split was repaired using six 2-0 FiberWire sutures.  Grasping suture was placed in the corner of the infraspinatus tear, which was then brought to the distal aspect of the posterior portion of the supraspinatus, which was intact and attached to the footprint.   The two 5.5 corkscrew suture anchors were then placed in the medial aspect of the footprint.  The L- shaped limb was then mobilized anteriorly and laterally, at which time, the four remaining 2-0 FiberWire sutures were tied.  The four mattress sutures placed from the most posterior corkscrew were tied.  The four sutures from the anterior corkscrew were divided between the anterior limb of the supraspinatus, which was intact and the posterior limb of the infraspinatus and supraspinatus tendon, which had delaminated.  This gave a watertight repair.  The sutures were tied and then in cross fashion, four limbs placed posteriorly, four limbs placed anteriorly to form a mattress, which matted the tendon down in watertight fashion. Thorough irrigation was then performed.  Subacromial decompression was adequate.  Incision was then irrigated and closed using 0 Vicryl suture, 2-0 Vicryl suture and a running 3-0 Monocryl.  Shoulder immobilizer was placed.  The patient tolerated the procedure well without immediate complication, transferred to the recovery room in stable condition.     Anderson Malta, M.D.     GSD/MEDQ  D:  05/31/2015  T:  05/31/2015  Job:  [id number]

## 2015-05-31 NOTE — Anesthesia Procedure Notes (Addendum)
Anesthesia Regional Block:  Interscalene brachial plexus block  Pre-Anesthetic Checklist: ,, timeout performed, Correct Patient, Correct Site, Correct Laterality, Correct Procedure, Correct Position, site marked, Risks and benefits discussed,  Surgical consent,  Pre-op evaluation,  At surgeon's request and post-op pain management  Laterality: Right  Prep: chloraprep       Needles:  Injection technique: Single-shot  Needle Type: Echogenic Stimulator Needle     Needle Length: 5cm 5 cm Needle Gauge: 22 and 22 G    Additional Needles:  Procedures: ultrasound guided (picture in chart) Interscalene brachial plexus block Narrative:  Start time: 05/31/2015 7:05 AM End time: 05/31/2015 7:07 AM Injection made incrementally with aspirations every 5 mL.  Performed by: Personally  Anesthesiologist: Catalina Gravel  Additional Notes: Functioning IV was confirmed and monitors were applied.  A 39mm 22ga Arrow echogenic stimulator needle was used. Sterile prep, hand hygiene and sterile gloves were used.  Negative aspiration and negative test dose prior to incremental administration of local anesthetic. The patient tolerated the procedure well.  Ultrasound guidance: relevent anatomy identified, needle position confirmed, local anesthetic spread visualized around nerve(s), vascular puncture avoided.  Image printed for medical record.    Procedure Name: Intubation Date/Time: 05/31/2015 7:50 AM Performed by: Collier Bullock Pre-anesthesia Checklist: Patient identified, Emergency Drugs available, Suction available and Patient being monitored Patient Re-evaluated:Patient Re-evaluated prior to inductionOxygen Delivery Method: Circle system utilized Preoxygenation: Pre-oxygenation with 100% oxygen Intubation Type: IV induction Ventilation: Mask ventilation without difficulty Laryngoscope Size: Mac and 3 Grade View: Grade I Tube type: Oral Number of attempts: 1 Airway Equipment and  Method: Stylet Placement Confirmation: ETT inserted through vocal cords under direct vision,  positive ETCO2 and breath sounds checked- equal and bilateral Secured at: 24 cm Tube secured with: Tape Dental Injury: Teeth and Oropharynx as per pre-operative assessment

## 2015-05-31 NOTE — Transfer of Care (Signed)
Immediate Anesthesia Transfer of Care Note  Patient: Dale Glover.  Procedure(s) Performed: Procedure(s): RIGHT SHOULDER ARTHROSCOPY WITH MINI-OPEN ROTATOR CUFF REPAIR AND SUBACROMIAL DECOMPRESSION (Right)  Patient Location: PACU  Anesthesia Type:General  Level of Consciousness: awake, alert , oriented and patient cooperative  Airway & Oxygen Therapy: Patient Spontanous Breathing and Patient connected to face mask oxygen  Post-op Assessment: Report given to RN, Post -op Vital signs reviewed and stable and Patient moving all extremities  Post vital signs: Reviewed and stable  Last Vitals:  Filed Vitals:   05/31/15 0626  BP: 150/76  Pulse: 70  Temp: 37.2 C  Resp: 20    Complications: No apparent anesthesia complications

## 2015-05-31 NOTE — Brief Op Note (Signed)
05/31/2015  10:43 AM  PATIENT:  Dale Glover.  65 y.o. male  PRE-OPERATIVE DIAGNOSIS:  RIGHT SHOULDER ROTATOR CUFF TEAR   POST-OPERATIVE DIAGNOSIS:  RIGHT SHOULDER ROTATOR CUFF TEAR   PROCEDURE:  Procedure(s): RIGHT SHOULDER ARTHROSCOPY WITH MINI-OPEN ROTATOR CUFF REPAIR AND SUBACROMIAL DECOMPRESSION  SURGEON:  Surgeon(s): Meredith Pel, MD  ASSISTANT: Johnell Comings RNFA  ANESTHESIA:   general  EBL: 50 ml    Total I/O In: 1000 [I.V.:1000] Out: 25 [Blood:25]  BLOOD ADMINISTERED: none  DRAINS: none   LOCAL MEDICATIONS USED:  none  SPECIMEN:  No Specimen  COUNTS:  YES  TOURNIQUET:  * No tourniquets in log *  DICTATION: .Other Dictation: Dictation Number [id number]  PLAN OF CARE: Discharge to home after PACU  PATIENT DISPOSITION:  PACU - hemodynamically stable

## 2015-06-01 ENCOUNTER — Encounter (HOSPITAL_COMMUNITY): Payer: Self-pay | Admitting: Orthopedic Surgery

## 2015-09-15 ENCOUNTER — Other Ambulatory Visit (HOSPITAL_COMMUNITY): Payer: Self-pay | Admitting: Orthopedic Surgery

## 2015-09-15 DIAGNOSIS — R52 Pain, unspecified: Secondary | ICD-10-CM

## 2015-09-16 ENCOUNTER — Ambulatory Visit (HOSPITAL_COMMUNITY): Payer: Commercial Managed Care - PPO

## 2016-07-14 IMAGING — XA DG FLUORO GUIDE NDL PLC/BX
1 series · 3 of 3 positions shown · non-contrast
Comparison: none

CLINICAL DATA: Right shoulder pain.

[Series 1: ortho standard · 3 of 3 slices shown]
[im 1/3]
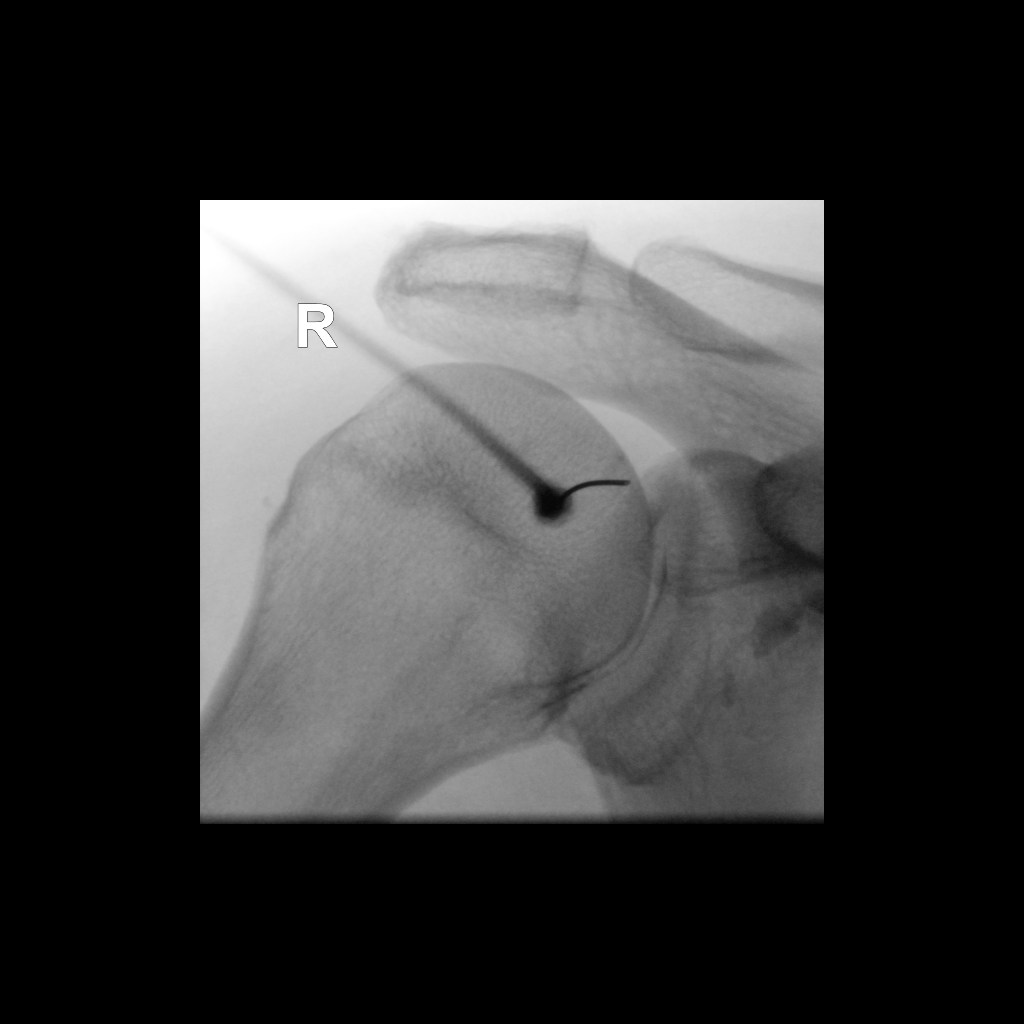
[im 2/3]
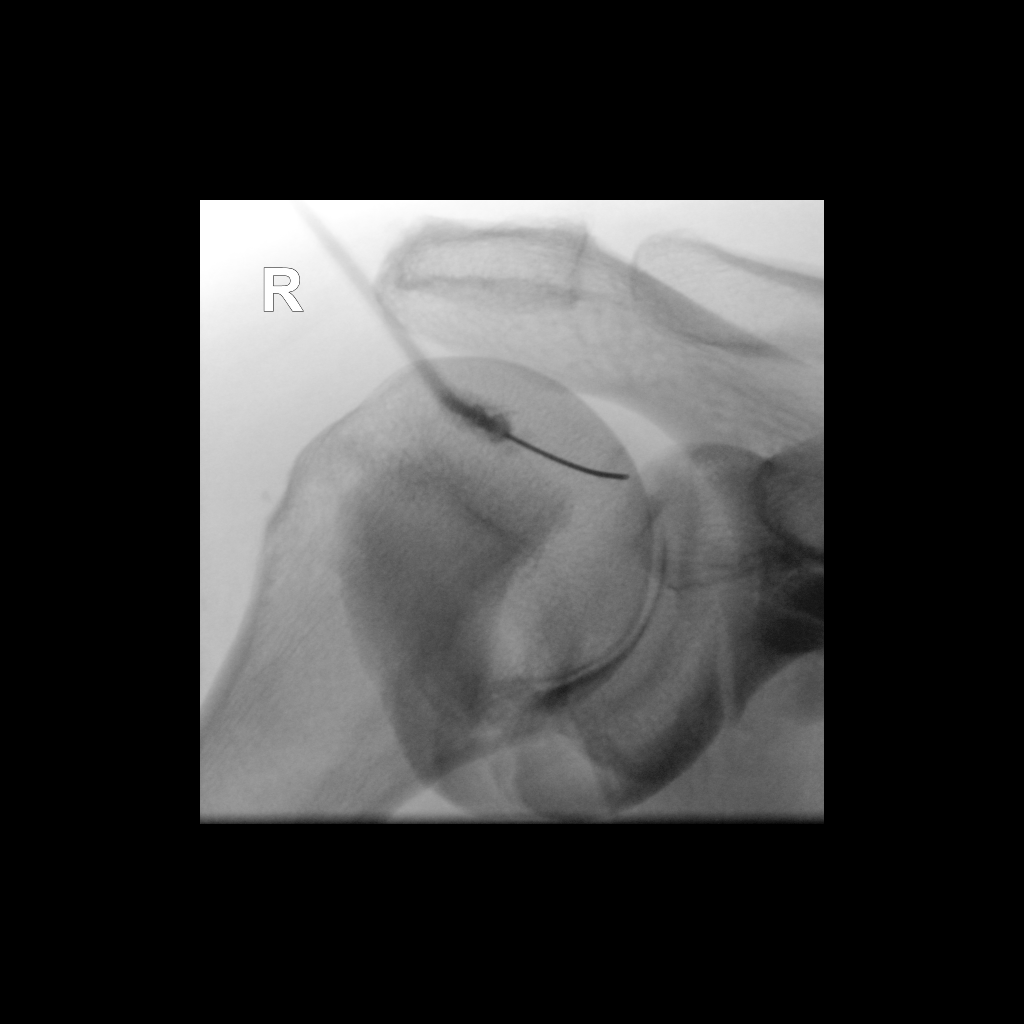
[im 3/3]
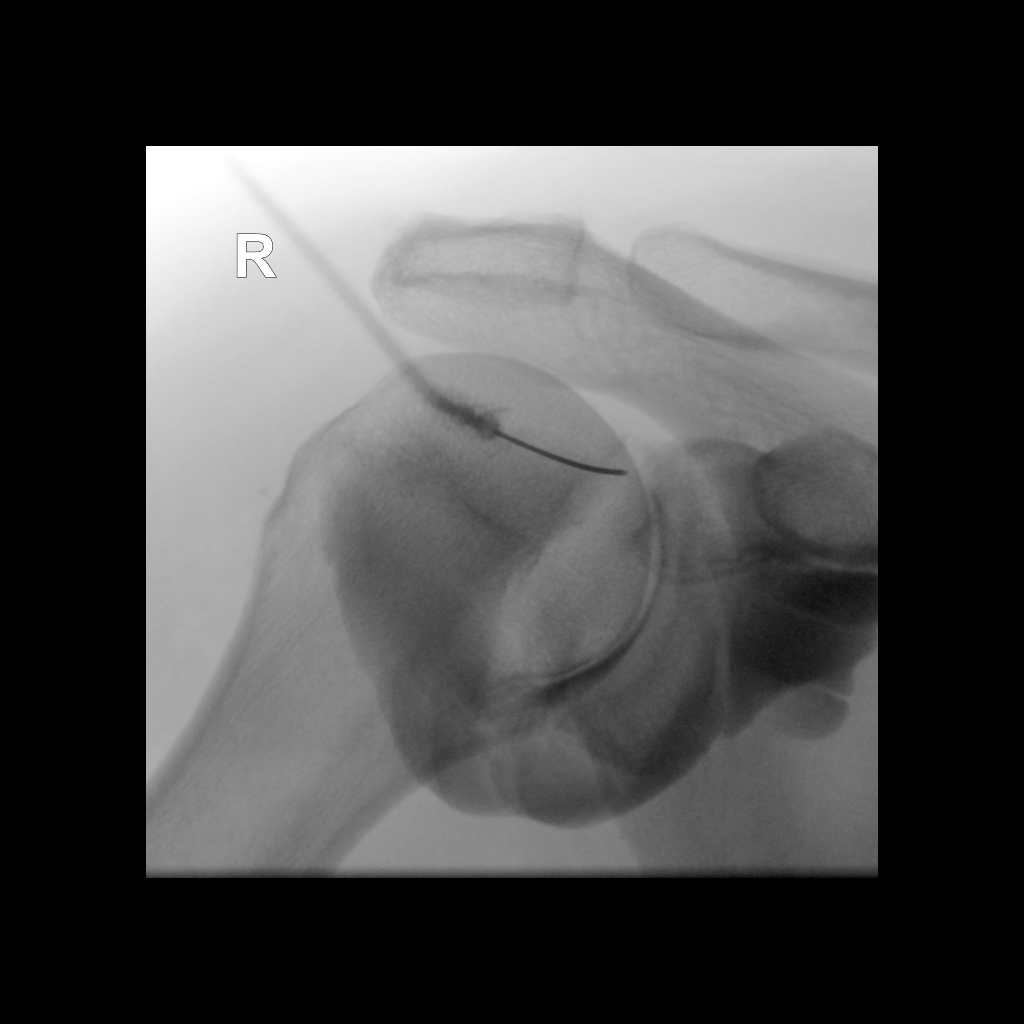

[3 of 3 positions shown; findings below may reference images not displayed]

FLUOROSCOPY TIME:  38.88 uGy*m2

PROCEDURE:
Right SHOULDER INJECTION UNDER FLUOROSCOPY

An appropriate skin entrance site was determined. The site was
marked, prepped with Betadine, draped in the usual sterile fashion,
and infiltrated locally with buffered Lidocaine. 22 gauge spinal
needle was advanced to the superomedial margin of the humeral head
under intermittent fluoroscopy. 1 ml of Lidocaine injected easily. A
mixture of 0.1 ml Multihance and 20 ml of dilute Isovue M 200 was
then used to opacify the right shoulder capsule. No immediate
complication.
IMPRESSION: Technically successful right shoulder injection for MRI.

## 2020-11-03 ENCOUNTER — Ambulatory Visit
Payer: No Typology Code available for payment source | Attending: Student in an Organized Health Care Education/Training Program | Admitting: Student in an Organized Health Care Education/Training Program

## 2020-11-03 ENCOUNTER — Ambulatory Visit (HOSPITAL_BASED_OUTPATIENT_CLINIC_OR_DEPARTMENT_OTHER)
Admission: RE | Admit: 2020-11-03 | Discharge: 2020-11-03 | Disposition: A | Discharge status: Home / Self Care | Payer: No Typology Code available for payment source | Source: Ambulatory Visit | Admitting: Radiology

## 2020-11-03 ENCOUNTER — Other Ambulatory Visit: Payer: Self-pay

## 2020-11-03 ENCOUNTER — Encounter (HOSPITAL_COMMUNITY): Payer: Self-pay | Admitting: Student in an Organized Health Care Education/Training Program

## 2020-11-03 VITALS — Temp 97.9°F | Ht 71.0 in | Wt 200.0 lb

## 2020-11-03 DIAGNOSIS — M1812 Unilateral primary osteoarthritis of first carpometacarpal joint, left hand: Secondary | ICD-10-CM

## 2020-11-03 DIAGNOSIS — M25641 Stiffness of right hand, not elsewhere classified: Secondary | ICD-10-CM | POA: Insufficient documentation

## 2020-11-03 DIAGNOSIS — M79643 Pain in unspecified hand: Secondary | ICD-10-CM | POA: Insufficient documentation

## 2020-11-03 DIAGNOSIS — M25642 Stiffness of left hand, not elsewhere classified: Secondary | ICD-10-CM | POA: Insufficient documentation

## 2020-11-03 DIAGNOSIS — M1811 Unilateral primary osteoarthritis of first carpometacarpal joint, right hand: Secondary | ICD-10-CM

## 2020-11-03 NOTE — Progress Notes (Signed)
[facility]  DEPARTMENT OF [facility]    Clinic Progress Note     Patient: Ryan Roy  DOB: 1950/07/13  MRN: [id number]    Date: 11/03/2020    CHIEF COMPLAINT:  Chief Complaint   Patient presents with   .  Pain        HISTORY OF PRESENT ILLNESS:  70 year old male with complaints of swelling in his bilateral hands and stiffness.  He states this began about 3 months ago.  He denies any traumas to his hands recently.  He states it is worse on the right side.  He states that initially he had some pain in his hands with this problem but his PCP prescribed him with meloxicam which has significantly improved his pain.  He does still endorse some pain along the metacarpals of the index and long finger bilaterally but states this is much improved.    Although he is retired he does still perform a lot of activities around his house and is noted is difficult for him due to his swelling and stiffness of his fingers.  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    Hand dominance: Right  Work: Retired.  Tobacco use: Denies.  Alcohol use: Seldom.  Patient ambulation status: Unassisted.   Antiplatelets/Anticoagulation includes: ASA.    History reviewed. No pertinent past medical history.  Past Medical History was reviewed and is negative for For gout in his hands or previous problems with his upper extremities.       Past Surgical History was reviewed and is negative for previous surgeries on his bilateral hands.        Family Medical History:    None              Current Outpatient Medications:   .  amLODIPine (NORVASC) 5 mg Oral Tablet, Take 5 mg by mouth Once a day, Disp: , Rfl:   .  aspirin (ECOTRIN) 81 mg Oral Tablet, Delayed Release (E.C.), Take 81 mg by mouth, Disp: , Rfl:   .  atorvastatin (LIPITOR) 20 mg Oral Tablet, Take 1 Tablet by mouth Once a day, Disp: , Rfl:   .  lisinopriL  (PRINIVIL) 20 mg Oral Tablet, Take 20 mg by mouth, Disp: , Rfl:   .  meloxicam (MOBIC) 15 mg Oral Tablet, TAKE ONE TABLET BY MOUTH DAILY WITH FOOD AS NEEDED FOR PAIN, Disp: , Rfl:   .  metFORMIN (GLUCOPHAGE XR) 500 mg Oral Tablet Sustained Release 24 hr, TAKE 2 TABLETS BY MOUTH DAILY WITH THE LARGEST MEAL OF THE DAY, Disp: , Rfl:   .  omeprazole (PRILOSEC) 20 mg Oral Capsule, Delayed Release(E.C.), Take 20 mg by mouth Once a day, Disp: , Rfl:      Allergies   Allergen Reactions   . Penicillins  Other Adverse Reaction (Add comment)        REVIEW OF SYSTEMS:  Review of Systems     PHYSICAL EXAM:  Temp 36.6 C (97.9 F)   Ht 1.803 m (5\' 11" )   Wt 90.7 kg (200 lb)   BMI 27.89 kg/m         VITAL SIGNS: The vital signs and BMI are recorded in the cart, and reviewed prior to seeing the patient today.   GENERAL: The patient is well developed and well nourished, awake, alert and oriented and appropriate mood and affect.  Normal gait and station.     BILATERAL UPPER EXTREMITY  SKIN: The skin over the bilateral hands shows no rash, lesion or erythema and is comparable to the contralateral side.   SWELLING: There is moderate focal swelling of the hands and fingers, worse on the right.  TENDERNESS: There is no tenderness to palpation over the IP joints of the fingers bilaterally or MCP joints of the fingers bilaterally.  Mild tenderness to palpation over the thumb basal joints bilaterally.  Very mild tenderness to palpation over the metacarpals of the index and long finger bilaterally.  Nontender palpation over extensor tendons proximally in hand or over wrist.  ROM: Active ROM of the fingers demonstrates lack of full flexion, worse in the right hand, secondary to significant swelling of the digits.  LIGAMENTS: There is no gross ligamentous laxity. Negative Watson shift test.   MUSCLE:  Positive FPL, EPL, interosseous muscle function.  SENSATION: Sensation intact to light touch in axillary, median, radial and ulnar nerve  distributions.  VASCULAR: Palpable radial pulse bilaterally. Excellent capillary refill to all digits.       TESTS REVIEWED:  None.    IMAGING:  Three-view radiographs of the bilateral hands (AP, lateral oblique) were performed today in clinic and reveal no acute osseous abnormalities.  There is noted joint space narrowing throughout multiple IP joints of bilateral hands as well as some joint space narrowing of the thumb basal joints bilaterally.    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  I discussed that I believe the swelling in his hands is what is causing his decreased motion.  However, I am not sure of the reason for the swelling.  I counseled him that are there are many medical conditions that can cause swelling of the hands, some of which include cardiovascular issues or gout, a condition which she has had the past.  I counseled him to discuss allopurinol for gout prevention with his PCP.  I also eecommended occupational therapy with a hand therapist and will provide him with a script for this today.    The patient was given the opportunity to ask questions. All of their questions were answered to their satisfaction. The patient is in agreement with our current treatment plan.     The patient will follow-up p.r.n.    Patient seen and examined by Adonis Housekeeper, MD on 11/03/20 at 12:11.    Please note:  This note was transcribed using voice recognition software.  Because of this technology, there is often unintended grammatical, spelling and other transcription errors.  Please disregard these areas.

## 2022-02-21 NOTE — H&P (Signed)
Patient's anticipated LOS is less than 2 midnights, meeting these requirements: - Younger than 36 - Lives within 1 hour of care - Has a competent adult at home to recover with post-op recover - NO history of  - Chronic pain requiring opiods  - Diabetes  - Coronary Artery Disease  - Heart failure  - Heart attack  - Stroke  - DVT/VTE  - Cardiac arrhythmia  - Respiratory Failure/COPD  - Renal failure  - Anemia  - Advanced Liver disease     Dale Glover. is an 71 y.o. male.    Chief Complaint: left shoulder pain  HPI: Pt is a 71 y.o. male complaining of left shoulder pain for multiple years. Pain had continually increased since the beginning. X-rays in the clinic show end-stage arthritic changes of the left shoulder. Pt has tried various conservative treatments which have failed to alleviate their symptoms, including injections and therapy. Various options are discussed with the patient. Risks, benefits and expectations were discussed with the patient. Patient understand the risks, benefits and expectations and wishes to proceed with surgery.   PCP:  Pcp, No  D/C Plans: Home  PMH: Past Medical History:  Diagnosis Date   Anxiety    Arthritis    Cancer ([place])    basal cell carcinoma of the skin   Depression    Diverticulosis    GERD (gastroesophageal reflux disease)    Glaucoma    H/O: GI bleed    Heart murmur    Hypercholesterolemia    Hypertension    Rotator cuff tear    right   Wears glasses     PSH: Past Surgical History:  Procedure Laterality Date   CARPAL TUNNEL RELEASE     right wrist   COLONOSCOPY     SHOULDER ARTHROSCOPY WITH OPEN ROTATOR CUFF REPAIR AND DISTAL CLAVICLE ACROMINECTOMY Right 05/31/2015   Procedure: RIGHT SHOULDER ARTHROSCOPY WITH MINI-OPEN ROTATOR CUFF REPAIR AND SUBACROMIAL DECOMPRESSION;  Surgeon: Meredith Pel, MD;  Location: [facility];  Service: Orthopedics;  Laterality: Right;   WISDOM TOOTH EXTRACTION      Social History:   reports that he has been smoking cigars. He has never used smokeless tobacco. He reports current alcohol use. He reports that he does not use drugs. BMI: Estimated body mass index is 36.04 kg/m as calculated from the following:   Height as of 05/31/15: '5\' 8"'$  (1.727 m).   Weight as of 05/31/15: 107.5 kg.  Lab Results  Component Value Date   ALBUMIN 4.0 05/28/2015   Diabetes: Patient does not have a diagnosis of diabetes.     Smoking Status:   reports that he has been smoking cigars. He has never used smokeless tobacco.    Allergies:  Allergies  Allergen Reactions   Loratadine Other (See Comments)    Unknown   Statins Other (See Comments)    Muscle aches   Erythromycin Rash    Medications: No current facility-administered medications for this encounter.   Current Outpatient Medications  Medication Sig Dispense Refill   celecoxib (CELEBREX) 200 MG capsule Take 200 mg by mouth 2 (two) times daily.     dorzolamide-timolol (COSOPT) 22.3-6.8 MG/ML ophthalmic solution Place 1 drop into the right eye 2 (two) times daily.     ezetimibe (ZETIA) 10 MG tablet Take 10 mg by mouth daily.     HYDROcodone-acetaminophen (NORCO) 5-325 MG tablet Take 1-2 tablets by mouth every 6 (six) hours as needed for moderate pain. 60 tablet 0  losartan (COZAAR) 50 MG tablet Take 50 mg by mouth daily.     methocarbamol (ROBAXIN) 500 MG tablet Take 1 tablet (500 mg total) by mouth 4 (four) times daily. 30 tablet 0   Multiple Vitamin (MULTIVITAMIN WITH MINERALS) TABS tablet Take 1 tablet by mouth daily.     omega-3 acid ethyl esters (LOVAZA) 1 g capsule Take 2 g by mouth 2 (two) times daily.     omeprazole (PRILOSEC OTC) 20 MG tablet Take 10 mg by mouth daily.     psyllium (METAMUCIL) 58.6 % packet Take 1 packet by mouth daily.     sertraline (ZOLOFT) 50 MG tablet Take 50 mg by mouth daily.      No results found for this or any previous visit (from the past 48 hour(s)). No results found.  ROS: Pain  with rom of the left upper extremity  Physical Exam: Alert and oriented 71 y.o. male in no acute distress Cranial nerves 2-12 intact Cervical spine: full rom with no tenderness, nv intact distally Chest: active breath sounds bilaterally, no wheeze rhonchi or rales Heart: regular rate and rhythm, no murmur Abd: non tender non distended with active bowel sounds Hip is stable with rom  Left shoulder pain and weakness with rom Nv intact distally No rashes or edema  Assessment/Plan Assessment: left shoulder cuff arthropathy  Plan:  Patient will undergo a left reverse total shoulder by Dr. Veverly Fells at [place] Risks benefits and expectations were discussed with the patient. Patient understand risks, benefits and expectations and wishes to proceed. Preoperative templating of the joint replacement has been completed, documented, and submitted to the Operating Room personnel in order to optimize intra-operative equipment management.   Merla Riches PA-C, MPAS [facility] is now [employer] [facility] [address] [place], [place],  [address] Phone: [phone] [email or web address] Facebook  [employer]

## 2022-03-03 NOTE — Progress Notes (Addendum)
COVID Vaccine Completed: Yes  Date of COVID positive in last 90 days:  PCP -  Cardiologist - Helene Kelp, MD  Chest x-ray -  EKG - 01-25-22 Epic Stress Test - 2018 [facility] ECHO - 2018 [facility]  Cardiac Cath -  Pacemaker/ICD device last checked: Spinal Cord Stimulator:  Bowel Prep -   Sleep Study -  CPAP -   Fasting Blood Sugar -  Checks Blood Sugar _____ times a day  Blood Thinner Instructions: Aspirin Instructions: Last Dose:  Activity level:  Can go up a flight of stairs and perform activities of daily living without stopping and without symptoms of chest pain or shortness of breath.  Able to exercise without symptoms  Unable to go up a flight of stairs without symptoms of     Anesthesia review:  Murmur. Shortness of breath evaluated by cardiology in 2018  Patient denies shortness of breath, fever, cough and chest pain at PAT appointment  Patient verbalized understanding of instructions that were given to them at the PAT appointment. Patient was also instructed that they will need to review over the PAT instructions again at home before surgery.

## 2022-03-03 NOTE — Patient Instructions (Addendum)
SURGICAL WAITING ROOM VISITATION Patients having surgery or a procedure may have no more than 2 support people in the waiting area - these visitors may rotate.   Children under the age of 21 must have an adult with them who is not the patient. If the patient needs to stay at the hospital during part of their recovery, the visitor guidelines for inpatient rooms apply. Pre-op nurse will coordinate an appropriate time for 1 support person to accompany patient in pre-op.  This support person may not rotate.    Please refer to the [facility] website for the visitor guidelines for Inpatients (after your surgery is over and you are in a regular room).      Your procedure is scheduled on: 03-13-22   Report to [place] Entrance    Report to admitting at 7:45 AM   Call this number if you have problems the morning of surgery [phone]   Do not eat food :After Midnight.   After Midnight you may have the following liquids until 7:15 AM DAY OF SURGERY  Water Non-Citrus Juices (without pulp, NO RED) Carbonated Beverages Black Coffee (NO MILK/CREAM OR CREAMERS, sugar ok)  Clear Tea (NO MILK/CREAM OR CREAMERS, sugar ok) regular and decaf                             Plain Jell-O (NO RED)                                           Fruit ices (not with fruit pulp, NO RED)                                     Popsicles (NO RED)                                                               Sports drinks like Gatorade (NO RED)                   The day of surgery:  Drink ONE (1) Pre-Surgery G2 at 7:15 AM the morning of surgery. Drink in one sitting. Do not sip.  This drink was given to you during your hospital  pre-op appointment visit. Nothing else to drink after completing the Pre-Surgery G2.          If you have questions, please contact your surgeon's office.   FOLLOW  ANY ADDITIONAL PRE OP INSTRUCTIONS YOU RECEIVED FROM YOUR SURGEON'S OFFICE!!!     Oral Hygiene is also  important to reduce your risk of infection.                                    Remember - BRUSH YOUR TEETH THE MORNING OF SURGERY WITH YOUR REGULAR TOOTHPASTE   Do NOT smoke after Midnight   Take these medicines the morning of surgery with A SIP OF WATER:   Ezetimibe  Omeprazole  Sertraline  Okay to use eyedrops  DO  NOT TAKE ANY ORAL DIABETIC MEDICATIONS DAY OF YOUR SURGERY  Bring CPAP mask and tubing day of surgery.                              You may not have any metal on your body including  jewelry, and body piercing             Do not wear  lotions, powders, cologne, or deodorant              Men may shave face and neck.   Do not bring valuables to the hospital. [place].   Contacts, dentures or bridgework may not be worn into surgery.   DO NOT [place]. PHARMACY WILL DISPENSE MEDICATIONS LISTED ON YOUR MEDICATION LIST TO YOU DURING YOUR ADMISSION [place]!    Patients discharged on the day of surgery will not be allowed to drive home.  Someone NEEDS to stay with you for the first 24 hours after anesthesia.   Special Instructions: Bring a copy of your healthcare power of attorney and living will documents the day of surgery if you haven't scanned them before.              Please read over the following fact sheets you were given: IF [place]  If you received a COVID test during your pre-op visit  it is requested that you wear a mask when out in public, stay away from anyone that may not be feeling well and notify your surgeon if you develop symptoms. If you test positive for Covid or have been in contact with anyone that has tested positive in the last 10 days please notify you surgeon.  [place]- Preparing for Total Shoulder Arthroplasty    Before surgery, you can play an important role. Because skin is not sterile, your  skin needs to be as free of germs as possible. You can reduce the number of germs on your skin by using the following products. Benzoyl Peroxide Gel Reduces the number of germs present on the skin Applied twice a day to shoulder area starting two days before surgery    ==================================================================  Please follow these instructions carefully:  BENZOYL PEROXIDE 5% GEL  Please do not use if you have an allergy to benzoyl peroxide.   If your skin becomes reddened/irritated stop using the benzoyl peroxide.  Starting two days before surgery, apply as follows: Apply benzoyl peroxide in the morning and at night. Apply after taking a shower. If you are not taking a shower clean entire shoulder front, back, and side along with the armpit with a clean wet washcloth.  Place a quarter-sized dollop on your shoulder and rub in thoroughly, making sure to cover the front, back, and side of your shoulder, along with the armpit.   2 days before ____ AM   ____ PM              1 day before ____ AM   ____ PM                         Do this twice a day for two days.  (Last application is the night before surgery, AFTER using the CHG soap as described below).  Do NOT apply benzoyl peroxide gel on  the day of surgery.   [place] - Preparing for Surgery Before surgery, you can play an important role.  Because skin is not sterile, your skin needs to be as free of germs as possible.  You can reduce the number of germs on your skin by washing with CHG (chlorahexidine gluconate) soap before surgery.  CHG is an antiseptic cleaner which kills germs and bonds with the skin to continue killing germs even after washing. Please DO NOT use if you have an allergy to CHG or antibacterial soaps.  If your skin becomes reddened/irritated stop using the CHG and inform your nurse when you arrive at Short Stay. Do not shave (including legs and underarms) for at least 48 hours prior to the first  CHG shower.  You may shave your face/neck.  Please follow these instructions carefully:  1.  Shower with CHG Soap the night before surgery and the  morning of surgery.  2.  If you choose to wash your hair, wash your hair first as usual with your normal  shampoo.  3.  After you shampoo, rinse your hair and body thoroughly to remove the shampoo.                             4.  Use CHG as you would any other liquid soap.  You can apply chg directly to the skin and wash.  Gently with a scrungie or clean washcloth.  5.  Apply the CHG Soap to your body ONLY FROM THE NECK DOWN.   Do   not use on face/ open                           Wound or open sores. Avoid contact with eyes, ears mouth and   genitals (private parts).                       Wash face,  Genitals (private parts) with your normal soap.             6.  Wash thoroughly, paying special attention to the area where your    surgery  will be performed.  7.  Thoroughly rinse your body with warm water from the neck down.  8.  DO NOT shower/wash with your normal soap after using and rinsing off the CHG Soap.                9.  Pat yourself dry with a clean towel.            10.  Wear clean pajamas.            11.  Place clean sheets on your bed the night of your first shower and do not  sleep with pets. Day of Surgery : Do not apply any lotions/deodorants the morning of surgery.  Please wear clean clothes to the hospital/surgery center.  FAILURE TO FOLLOW THESE INSTRUCTIONS MAY RESULT IN THE CANCELLATION OF YOUR SURGERY  PATIENT SIGNATURE_________________________________  NURSE SIGNATURE__________________________________  ________________________________________________________________________     Adam Phenix  An incentive spirometer is a tool that can help keep your lungs clear and active. This tool measures how well you are filling your lungs with each breath. Taking long deep breaths may help reverse or decrease the chance of  developing breathing (pulmonary) problems (especially infection) following: A long period of time when you are unable to move  or be active. BEFORE THE PROCEDURE  If the spirometer includes an indicator to show your best effort, your nurse or respiratory therapist will set it to a desired goal. If possible, sit up straight or lean slightly forward. Try not to slouch. Hold the incentive spirometer in an upright position. INSTRUCTIONS FOR USE  Sit on the edge of your bed if possible, or sit up as far as you can in bed or on a chair. Hold the incentive spirometer in an upright position. Breathe out normally. Place the mouthpiece in your mouth and seal your lips tightly around it. Breathe in slowly and as deeply as possible, raising the piston or the ball toward the top of the column. Hold your breath for 3-5 seconds or for as long as possible. Allow the piston or ball to fall to the bottom of the column. Remove the mouthpiece from your mouth and breathe out normally. Rest for a few seconds and repeat Steps 1 through 7 at least 10 times every 1-2 hours when you are awake. Take your time and take a few normal breaths between deep breaths. The spirometer may include an indicator to show your best effort. Use the indicator as a goal to work toward during each repetition. After each set of 10 deep breaths, practice coughing to be sure your lungs are clear. If you have an incision (the cut made at the time of surgery), support your incision when coughing by placing a pillow or rolled up towels firmly against it. Once you are able to get out of bed, walk around indoors and cough well. You may stop using the incentive spirometer when instructed by your caregiver.  RISKS AND COMPLICATIONS Take your time so you do not get dizzy or light-headed. If you are in pain, you may need to take or ask for pain medication before doing incentive spirometry. It is harder to take a deep breath if you are having pain. AFTER  USE Rest and breathe slowly and easily. It can be helpful to keep track of a log of your progress. Your caregiver can provide you with a simple table to help with this. If you are using the spirometer at home, follow these instructions: [place] IF:  You are having difficultly using the spirometer. You have trouble using the spirometer as often as instructed. Your pain medication is not giving enough relief while using the spirometer. You develop fever of 100.5 F (38.1 C) or higher. SEEK IMMEDIATE MEDICAL CARE IF:  You cough up bloody sputum that had not been present before. You develop fever of 102 F (38.9 C) or greater. You develop worsening pain at or near the incision site. MAKE SURE YOU:  Understand these instructions. Will watch your condition. Will get help right away if you are not doing well or get worse. Document Released: 09/11/2006 Document Revised: 07/24/2011 Document Reviewed: 11/12/2006 [facility] Patient Information 2014 [place], [place].   ________________________________________________________________________

## 2022-03-07 ENCOUNTER — Other Ambulatory Visit: Payer: Self-pay

## 2022-03-07 ENCOUNTER — Encounter (HOSPITAL_COMMUNITY): Payer: Self-pay

## 2022-03-07 ENCOUNTER — Encounter (HOSPITAL_COMMUNITY)
Admission: RE | Admit: 2022-03-07 | Discharge: 2022-03-07 | Disposition: A | Discharge status: Home / Self Care | Payer: Medicare Other | Source: Ambulatory Visit | Attending: Orthopedic Surgery | Admitting: Orthopedic Surgery

## 2022-03-07 VITALS — BP 151/88 | HR 63 | Temp 98.3°F | Resp 20 | Ht 68.0 in | Wt 252.0 lb

## 2022-03-07 DIAGNOSIS — I2510 Atherosclerotic heart disease of native coronary artery without angina pectoris: Secondary | ICD-10-CM | POA: Diagnosis not present

## 2022-03-07 DIAGNOSIS — R7303 Prediabetes: Secondary | ICD-10-CM | POA: Insufficient documentation

## 2022-03-07 DIAGNOSIS — Z01818 Encounter for other preprocedural examination: Secondary | ICD-10-CM

## 2022-03-07 DIAGNOSIS — Z01812 Encounter for preprocedural laboratory examination: Secondary | ICD-10-CM | POA: Diagnosis present

## 2022-03-07 HISTORY — DX: Prediabetes: R73.03

## 2022-03-07 LAB — GLUCOSE, CAPILLARY: Glucose-Capillary: 113 mg/dL — ABNORMAL HIGH (ref 70–99)

## 2022-03-07 LAB — CBC
HCT: 42.8 % (ref 39.0–52.0)
Hemoglobin: 14.7 g/dL (ref 13.0–17.0)
MCH: 31.4 pg (ref 26.0–34.0)
MCHC: 34.3 g/dL (ref 30.0–36.0)
MCV: 91.5 fL (ref 80.0–100.0)
Platelets: 194 10*3/uL (ref 150–400)
RBC: 4.68 MIL/uL (ref 4.22–5.81)
RDW: 12.4 % (ref 11.5–15.5)
WBC: 6.4 10*3/uL (ref 4.0–10.5)
nRBC: 0 % (ref 0.0–0.2)

## 2022-03-07 LAB — BASIC METABOLIC PANEL
Anion gap: 11 (ref 5–15)
BUN: 23 mg/dL (ref 8–23)
CO2: 21 mmol/L — ABNORMAL LOW (ref 22–32)
Calcium: 9.2 mg/dL (ref 8.9–10.3)
Chloride: 104 mmol/L (ref 98–111)
Creatinine, Ser: 1 mg/dL (ref 0.61–1.24)
GFR, Estimated: >60 mL/min (ref >60)
Glucose, Bld: 101 mg/dL — ABNORMAL HIGH (ref 70–99)
Potassium: 4 mmol/L (ref 3.5–5.1)
Sodium: 136 mmol/L (ref 135–145)

## 2022-03-07 LAB — SURGICAL PCR SCREEN
MRSA, PCR: NEGATIVE
Staphylococcus aureus: POSITIVE — AB

## 2022-03-08 NOTE — Progress Notes (Signed)
PCR results sent to Dr. Norris to review. 

## 2022-03-11 NOTE — Anesthesia Preprocedure Evaluation (Signed)
Anesthesia Evaluation  Patient identified by MRN, date of birth, ID band Patient awake    Reviewed: Allergy & Precautions, Patient's Chart, lab work & pertinent test results  Airway        Dental   Pulmonary Current Smoker,           Cardiovascular hypertension,      Neuro/Psych Anxiety    GI/Hepatic GERD  ,  Endo/Other    Renal/GU Lab Results      Component                Value               Date                      CREATININE               1.00                03/07/2022                K                        4.0                 03/07/2022                    Musculoskeletal  (+) Arthritis ,   Abdominal (+) + obese (BMI 38.3),   Peds  Hematology Lab Results      Component                Value               Date                            HGB                      14.7                03/07/2022                HCT                      42.8                03/07/2022                      PLT                      194                 03/07/2022              Anesthesia Other Findings   Reproductive/Obstetrics                             Anesthesia Physical Anesthesia Plan  ASA: 3  Anesthesia Plan: General   Post-op Pain Management: Regional block*, Minimal or no pain anticipated and Tylenol PO (pre-op)*   Induction:   PONV Risk Score and Plan: 2 and Treatment may vary due to age or medical condition, Midazolam and Ondansetron  Airway Management Planned: Oral ETT  Additional Equipment: None  Intra-op Plan:   Post-operative Plan: Extubation in OR  Informed Consent:     Dental advisory given  Plan Discussed with:   Anesthesia Plan Comments: (GA w L ISB)        Anesthesia Quick Evaluation

## 2022-03-13 ENCOUNTER — Ambulatory Visit (HOSPITAL_COMMUNITY): Payer: Medicare Other | Admitting: Physician Assistant

## 2022-03-13 ENCOUNTER — Ambulatory Visit (HOSPITAL_COMMUNITY): Payer: Medicare Other

## 2022-03-13 ENCOUNTER — Other Ambulatory Visit: Payer: Self-pay

## 2022-03-13 ENCOUNTER — Ambulatory Visit (HOSPITAL_BASED_OUTPATIENT_CLINIC_OR_DEPARTMENT_OTHER): Payer: Medicare Other | Admitting: Anesthesiology

## 2022-03-13 ENCOUNTER — Ambulatory Visit (HOSPITAL_COMMUNITY)
Admission: RE | Admit: 2022-03-13 | Discharge: 2022-03-13 | Disposition: A | Discharge status: Home / Self Care | Payer: Medicare Other | Attending: Orthopedic Surgery | Admitting: Orthopedic Surgery

## 2022-03-13 ENCOUNTER — Encounter (HOSPITAL_COMMUNITY)
Admission: RE | Disposition: A | Discharge status: Home / Self Care | Payer: Self-pay | Source: Home / Self Care | Attending: Orthopedic Surgery

## 2022-03-13 ENCOUNTER — Encounter (HOSPITAL_COMMUNITY): Payer: Self-pay | Admitting: Orthopedic Surgery

## 2022-03-13 DIAGNOSIS — K219 Gastro-esophageal reflux disease without esophagitis: Secondary | ICD-10-CM | POA: Insufficient documentation

## 2022-03-13 DIAGNOSIS — I10 Essential (primary) hypertension: Secondary | ICD-10-CM | POA: Diagnosis not present

## 2022-03-13 DIAGNOSIS — F419 Anxiety disorder, unspecified: Secondary | ICD-10-CM | POA: Insufficient documentation

## 2022-03-13 DIAGNOSIS — M1990 Unspecified osteoarthritis, unspecified site: Secondary | ICD-10-CM | POA: Diagnosis not present

## 2022-03-13 DIAGNOSIS — R7303 Prediabetes: Secondary | ICD-10-CM

## 2022-03-13 DIAGNOSIS — M75102 Unspecified rotator cuff tear or rupture of left shoulder, not specified as traumatic: Secondary | ICD-10-CM | POA: Diagnosis present

## 2022-03-13 DIAGNOSIS — F17210 Nicotine dependence, cigarettes, uncomplicated: Secondary | ICD-10-CM

## 2022-03-13 DIAGNOSIS — F17290 Nicotine dependence, other tobacco product, uncomplicated: Secondary | ICD-10-CM | POA: Insufficient documentation

## 2022-03-13 HISTORY — PX: REVERSE SHOULDER ARTHROPLASTY: SHX5054

## 2022-03-13 SURGERY — ARTHROPLASTY, SHOULDER, TOTAL, REVERSE
Anesthesia: General | Site: Shoulder | Laterality: Left

## 2022-03-13 MED ORDER — FENTANYL CITRATE PF 50 MCG/ML IJ SOSY: 25.0000 ug | PREFILLED_SYRINGE | INTRAMUSCULAR | Status: DC | PRN

## 2022-03-13 MED ORDER — SUGAMMADEX SODIUM 500 MG/5ML IV SOLN
INTRAVENOUS | Status: AC
  Filled 2022-03-13: qty 5

## 2022-03-13 MED ORDER — FENTANYL CITRATE PF 50 MCG/ML IJ SOSY
PREFILLED_SYRINGE | INTRAMUSCULAR | Status: AC
  Administered 2022-03-13: 50 ug
  Filled 2022-03-13: qty 2

## 2022-03-13 MED ORDER — ORAL CARE MOUTH RINSE: 15.0000 mL | Freq: Once | OROMUCOSAL | Status: AC

## 2022-03-13 MED ORDER — BUPIVACAINE-EPINEPHRINE (PF) 0.25% -1:200000 IJ SOLN
INTRAMUSCULAR | Status: AC
  Filled 2022-03-13: qty 30

## 2022-03-13 MED ORDER — EPHEDRINE SULFATE-NACL 50-0.9 MG/10ML-% IV SOSY
PREFILLED_SYRINGE | INTRAVENOUS | Status: DC | PRN
  Administered 2022-03-13: 7.5 mg via INTRAVENOUS

## 2022-03-13 MED ORDER — GLYCOPYRROLATE 0.2 MG/ML IJ SOLN
INTRAMUSCULAR | Status: AC
  Filled 2022-03-13: qty 1

## 2022-03-13 MED ORDER — FENTANYL CITRATE (PF) 100 MCG/2ML IJ SOLN
INTRAMUSCULAR | Status: DC | PRN
  Administered 2022-03-13: 100 ug via INTRAVENOUS

## 2022-03-13 MED ORDER — BUPIVACAINE HCL (PF) 0.5 % IJ SOLN
INTRAMUSCULAR | Status: DC | PRN
  Administered 2022-03-13: 5 mL via PERINEURAL

## 2022-03-13 MED ORDER — OXYCODONE-ACETAMINOPHEN 5-325 MG PO TABS: 1.0000 | ORAL_TABLET | ORAL | 0 refills | Status: AC | PRN

## 2022-03-13 MED ORDER — SUGAMMADEX SODIUM 500 MG/5ML IV SOLN
INTRAVENOUS | Status: DC | PRN
  Administered 2022-03-13: 350 mg via INTRAVENOUS

## 2022-03-13 MED ORDER — DEXMEDETOMIDINE HCL IN NACL 80 MCG/20ML IV SOLN
INTRAVENOUS | Status: DC | PRN
  Administered 2022-03-13 (×2): 4 ug via BUCCAL

## 2022-03-13 MED ORDER — ACETAMINOPHEN 10 MG/ML IV SOLN: 1000.0000 mg | Freq: Once | INTRAVENOUS | Status: DC | PRN

## 2022-03-13 MED ORDER — LIDOCAINE 2% (20 MG/ML) 5 ML SYRINGE
INTRAMUSCULAR | Status: DC | PRN
  Administered 2022-03-13: 100 mg via INTRAVENOUS

## 2022-03-13 MED ORDER — ONDANSETRON HCL 4 MG/2ML IJ SOLN: 4.0000 mg | Freq: Once | INTRAMUSCULAR | Status: DC | PRN

## 2022-03-13 MED ORDER — DEXAMETHASONE SODIUM PHOSPHATE 10 MG/ML IJ SOLN
INTRAMUSCULAR | Status: DC | PRN
  Administered 2022-03-13: 8 mg via INTRAVENOUS

## 2022-03-13 MED ORDER — BUPIVACAINE-EPINEPHRINE (PF) 0.25% -1:200000 IJ SOLN
INTRAMUSCULAR | Status: DC | PRN
  Administered 2022-03-13: 17 mL

## 2022-03-13 MED ORDER — FENTANYL CITRATE (PF) 100 MCG/2ML IJ SOLN
INTRAMUSCULAR | Status: AC
  Filled 2022-03-13: qty 2

## 2022-03-13 MED ORDER — LACTATED RINGERS IV SOLN: INTRAVENOUS | Status: DC

## 2022-03-13 MED ORDER — BUPIVACAINE LIPOSOME 1.3 % IJ SUSP
INTRAMUSCULAR | Status: DC | PRN
  Administered 2022-03-13: 10 mL via PERINEURAL

## 2022-03-13 MED ORDER — CEFAZOLIN SODIUM-DEXTROSE 2-4 GM/100ML-% IV SOLN
2.0000 g | INTRAVENOUS | Status: AC
  Administered 2022-03-13: 2 g via INTRAVENOUS
  Filled 2022-03-13: qty 100

## 2022-03-13 MED ORDER — GLYCOPYRROLATE PF 0.2 MG/ML IJ SOSY
PREFILLED_SYRINGE | INTRAMUSCULAR | Status: DC | PRN
  Administered 2022-03-13: .2 mg via INTRAVENOUS

## 2022-03-13 MED ORDER — STERILE WATER FOR IRRIGATION IR SOLN
Status: DC | PRN
  Administered 2022-03-13: 2000 mL

## 2022-03-13 MED ORDER — METHOCARBAMOL 500 MG IVPB - SIMPLE MED: 500.0000 mg | Freq: Four times a day (QID) | INTRAVENOUS | Status: DC | PRN

## 2022-03-13 MED ORDER — CHLORHEXIDINE GLUCONATE 0.12 % MT SOLN
15.0000 mL | Freq: Once | OROMUCOSAL | Status: AC
  Administered 2022-03-13: 15 mL via OROMUCOSAL

## 2022-03-13 MED ORDER — ONDANSETRON HCL 4 MG PO TABS: 4.0000 mg | ORAL_TABLET | Freq: Three times a day (TID) | ORAL | 1 refills | Status: AC | PRN

## 2022-03-13 MED ORDER — ONDANSETRON HCL 4 MG/2ML IJ SOLN
INTRAMUSCULAR | Status: DC | PRN
  Administered 2022-03-13: 4 mg via INTRAVENOUS

## 2022-03-13 MED ORDER — 0.9 % SODIUM CHLORIDE (POUR BTL) OPTIME
TOPICAL | Status: DC | PRN
  Administered 2022-03-13: 1000 mL

## 2022-03-13 MED ORDER — PROPOFOL 10 MG/ML IV BOLUS
INTRAVENOUS | Status: DC | PRN
  Administered 2022-03-13: 150 mg via INTRAVENOUS

## 2022-03-13 MED ORDER — PHENYLEPHRINE HCL-NACL 20-0.9 MG/250ML-% IV SOLN
INTRAVENOUS | Status: DC | PRN
  Administered 2022-03-13: 45 ug/min via INTRAVENOUS

## 2022-03-13 MED ORDER — METHOCARBAMOL 500 MG PO TABS: 500.0000 mg | ORAL_TABLET | Freq: Four times a day (QID) | ORAL | 1 refills | Status: AC | PRN

## 2022-03-13 MED ORDER — MIDAZOLAM HCL 2 MG/2ML IJ SOLN
INTRAMUSCULAR | Status: AC
  Administered 2022-03-13: 1 mg
  Filled 2022-03-13: qty 2

## 2022-03-13 MED ORDER — METHOCARBAMOL 500 MG PO TABS: 500.0000 mg | ORAL_TABLET | Freq: Four times a day (QID) | ORAL | Status: DC | PRN

## 2022-03-13 MED ORDER — ROCURONIUM BROMIDE 10 MG/ML (PF) SYRINGE
PREFILLED_SYRINGE | INTRAVENOUS | Status: DC | PRN
  Administered 2022-03-13: 70 mg via INTRAVENOUS
  Administered 2022-03-13: 10 mg via INTRAVENOUS

## 2022-03-13 MED ORDER — DEXMEDETOMIDINE HCL IN NACL 80 MCG/20ML IV SOLN
INTRAVENOUS | Status: AC
  Filled 2022-03-13: qty 20

## 2022-03-13 SURGICAL SUPPLY — 68 items
BAG COUNTER SPONGE SURGICOUNT (BAG) IMPLANT
BAG ZIPLOCK 12X15 (MISCELLANEOUS) IMPLANT
BIT DRILL 1.6MX128 (BIT) IMPLANT
BIT DRILL 170X2.5X (BIT) IMPLANT
BIT DRL 170X2.5X (BIT) ×1
BLADE SAG 18X100X1.27 (BLADE) ×1 IMPLANT
COVER BACK TABLE 60X90IN (DRAPES) ×1 IMPLANT
COVER SURGICAL LIGHT HANDLE (MISCELLANEOUS) ×1 IMPLANT
CUP HUMERAL 42 PLUS 3 (Orthopedic Implant) IMPLANT
DRAPE INCISE IOBAN 66X45 STRL (DRAPES) ×1 IMPLANT
DRAPE ORTHO SPLIT 77X108 STRL (DRAPES) ×2
DRAPE SHEET LG 3/4 BI-LAMINATE (DRAPES) ×1 IMPLANT
DRAPE SURG ORHT 6 SPLT 77X108 (DRAPES) ×2 IMPLANT
DRAPE TOP 10253 STERILE (DRAPES) ×1 IMPLANT
DRAPE U-SHAPE 47X51 STRL (DRAPES) ×1 IMPLANT
DRILL 2.5 (BIT) ×1
DRSG ADAPTIC 3X8 NADH LF (GAUZE/BANDAGES/DRESSINGS) ×1 IMPLANT
DURAPREP 26ML APPLICATOR (WOUND CARE) ×1 IMPLANT
ELECT BLADE TIP CTD 4 INCH (ELECTRODE) ×1 IMPLANT
ELECT NDL TIP 2.8 STRL (NEEDLE) ×1 IMPLANT
ELECT NEEDLE TIP 2.8 STRL (NEEDLE) ×1 IMPLANT
ELECT REM PT RETURN 15FT ADLT (MISCELLANEOUS) ×1 IMPLANT
EPI LT SZ 1 (Orthopedic Implant) ×1 IMPLANT
EPIPHYSIS LT SZ 1 (Orthopedic Implant) IMPLANT
FACESHIELD WRAPAROUND (MASK) ×1 IMPLANT
FACESHIELD WRAPAROUND OR TEAM (MASK) ×1 IMPLANT
GAUZE PAD ABD 8X10 STRL (GAUZE/BANDAGES/DRESSINGS) ×1 IMPLANT
GAUZE SPONGE 4X4 12PLY STRL (GAUZE/BANDAGES/DRESSINGS) ×1 IMPLANT
GLENOSPHERE XTEND LAT 42+0 STD (Miscellaneous) IMPLANT
GLOVE BIOGEL PI IND STRL 7.5 (GLOVE) ×1 IMPLANT
GLOVE BIOGEL PI IND STRL 8.5 (GLOVE) ×1 IMPLANT
GLOVE ORTHO TXT STRL SZ7.5 (GLOVE) ×1 IMPLANT
GLOVE SURG ORTHO 8.5 STRL (GLOVE) ×1 IMPLANT
GOWN STRL REUS W/ TWL XL LVL3 (GOWN DISPOSABLE) ×2 IMPLANT
GOWN STRL REUS W/TWL XL LVL3 (GOWN DISPOSABLE) ×2
KIT BASIN OR (CUSTOM PROCEDURE TRAY) ×1 IMPLANT
KIT TURNOVER KIT A (KITS) IMPLANT
MANIFOLD NEPTUNE II (INSTRUMENTS) ×1 IMPLANT
METAGLENE DELTA EXTEND (Trauma) IMPLANT
METAGLENE DXTEND (Trauma) ×1 IMPLANT
NDL MAYO CATGUT SZ4 TPR NDL (NEEDLE) ×1 IMPLANT
NEEDLE MAYO CATGUT SZ4 (NEEDLE) ×1 IMPLANT
NS IRRIG 1000ML POUR BTL (IV SOLUTION) ×1 IMPLANT
PACK SHOULDER (CUSTOM PROCEDURE TRAY) ×1 IMPLANT
PIN GUIDE 1.2 (PIN) IMPLANT
PIN GUIDE GLENOPHERE 1.5MX300M (PIN) IMPLANT
PIN METAGLENE 2.5 (PIN) IMPLANT
PROTECTOR NERVE ULNAR (MISCELLANEOUS) ×1 IMPLANT
RESTRAINT HEAD UNIVERSAL NS (MISCELLANEOUS) ×1 IMPLANT
SCREW 4.5X36MM (Screw) IMPLANT
SCREW LOCK 42 (Screw) IMPLANT
SLING ARM FOAM STRAP LRG (SOFTGOODS) IMPLANT
SMARTMIX MINI TOWER (MISCELLANEOUS)
SPIKE FLUID TRANSFER (MISCELLANEOUS) ×1 IMPLANT
SPONGE T-LAP 4X18 ~~LOC~~+RFID (SPONGE) ×1 IMPLANT
STEM DELTA DIA 10 HA (Stem) IMPLANT
STRIP CLOSURE SKIN 1/2X4 (GAUZE/BANDAGES/DRESSINGS) ×1 IMPLANT
SUCTION FRAZIER HANDLE 10FR (MISCELLANEOUS) ×1
SUCTION TUBE FRAZIER 10FR DISP (MISCELLANEOUS) ×1 IMPLANT
SUT FIBERWIRE #2 38 T-5 BLUE (SUTURE) ×4
SUT MNCRL AB 4-0 PS2 18 (SUTURE) ×1 IMPLANT
SUT VIC AB 0 CT1 36 (SUTURE) ×2 IMPLANT
SUT VIC AB 0 CT2 27 (SUTURE) ×1 IMPLANT
SUT VIC AB 2-0 CT1 27 (SUTURE) ×2
SUT VIC AB 2-0 CT1 TAPERPNT 27 (SUTURE) ×1 IMPLANT
SUTURE FIBERWR #2 38 T-5 BLUE (SUTURE) ×2 IMPLANT
TOWEL OR 17X26 10 PK STRL BLUE (TOWEL DISPOSABLE) ×1 IMPLANT
TOWER SMARTMIX MINI (MISCELLANEOUS) IMPLANT

## 2022-03-13 NOTE — Anesthesia Procedure Notes (Signed)
Procedure Name: Intubation Date/Time: 03/13/2022 10:29 AM  Performed by: Lavina Hamman, CRNAPre-anesthesia Checklist: Patient identified, Emergency Drugs available, Suction available, Patient being monitored and Timeout performed Patient Re-evaluated:Patient Re-evaluated prior to induction Oxygen Delivery Method: Circle system utilized Preoxygenation: Pre-oxygenation with 100% oxygen Induction Type: IV induction Ventilation: Mask ventilation without difficulty and Oral airway inserted - appropriate to patient size Laryngoscope Size: Mac and 4 Grade View: Grade I Tube type: Oral Tube size: 7.0 mm Number of attempts: 1 Airway Equipment and Method: Stylet Placement Confirmation: ETT inserted through vocal cords under direct vision, positive ETCO2, CO2 detector and breath sounds checked- equal and bilateral Secured at: 22 cm Tube secured with: Tape Dental Injury: Teeth and Oropharynx as per pre-operative assessment  Comments: ATOI

## 2022-03-13 NOTE — Transfer of Care (Signed)
Immediate Anesthesia Transfer of Care Note  Patient: Dale Glover.  Procedure(s) Performed: Procedure(s) with comments: Left REVERSE total SHOULDER ARTHROPLASTY (Left) - choice and interscalene block  Patient Location: PACU  Anesthesia Type:General  Level of Consciousness:  sedated, patient cooperative and responds to stimulation  Airway & Oxygen Therapy:Patient Spontanous Breathing and Patient connected to face mask oxgen  Post-op Assessment:  Report given to PACU RN and Post -op Vital signs reviewed and stable  Post vital signs:  Reviewed and stable  Last Vitals:  Vitals:   03/13/22 0938 03/13/22 0940  BP:    Pulse: (!) 58 (!) 59  Resp: 17 (!) 21  Temp:    SpO2: 73% 31%    Complications: No apparent anesthesia complications

## 2022-03-13 NOTE — Op Note (Unsigned)
NAME: Dale Glover, Dale Glover RECORD NO: [id number] ACCOUNT NO: [id number] DATE OF BIRTH: [birth year] FACILITY: [facility] LOCATION: [facility] PHYSICIAN: Doran Heater. Veverly Fells, MD  Operative Report   DATE OF PROCEDURE: 03/13/2022  PREOPERATIVE DIAGNOSIS:  Left shoulder rotator cuff tear, massive, irreparable.  POSTOPERATIVE DIAGNOSIS:  Left shoulder rotator cuff tear, massive, irreparable.  PROCEDURE PERFORMED:  Left reverse total shoulder arthroplasty using DePuy Delta Xtend prosthesis with subscapularis repair.  ATTENDING SURGEON:  Doran Heater. Veverly Fells, MD  ASSISTANT:  Charletta Cousin [place], [place], who was scrubbed during the entire procedure, and necessary for satisfactory completion of surgery.  ANESTHESIA:  General anesthesia was used plus interscalene block.  ESTIMATED BLOOD LOSS:  200 mL  FLUID REPLACEMENT:  1500 mL crystalloid.  COUNTS:  Instrument counts correct.  COMPLICATIONS:  No complications.  ANTIBIOTICS:  Perioperative antibiotics were given.  INDICATIONS:  The patient is a 71 year old male who presents with a history of worsening left shoulder pain secondary to rotator cuff tearing with loss of fixed focal mechanics.  Despite conservative management including exercises, rest and medications  the patient presents with ongoing and worsening pain and loss of function, desiring reverse shoulder replacement in order to restore fixed focal mechanics to the shoulder.  Informed consent obtained.  DESCRIPTION OF PROCEDURE:  After an adequate level of anesthesia was achieved, the patient was positioned in the modified beach chair position.  Left shoulder correctly identified and sterilely prepped and draped in the usual manner.  Timeout called,  verifying correct patient, correct site, we entered the patient's shoulder using a standard deltopectoral approach, starting at the coracoid process and extending down to the anterior humerus.  We used a 10 blade scalpel.  Dissection down through   subcutaneous tissues using Bovie.  Cephalic vein identified, taken laterally with the deltoid, pectoralis taken medially.  Conjoined tendon identified and retracted medially.  We then went ahead and placed our deep retractors.  We tenodesed the biceps in  situ with 0 Vicryl figure-of-eight suture x2.  Next, we went to the subscapularis and released that off the lesser tuberosity with a subperiosteal release.  We tagged with #2 FiberWire suture in a modified Mason-Allen suture technique to repair at the  end.  We released the inferior capsule progressively externally rotating and delivering the humeral head out of the wound.  We entered the proximal humerus with a 6 mm reamer, reaming up to a size 10.  We then used the 10 mm T-handle guide and resected  the head at 20 degrees of retroversion with 155 degrees.  We then went ahead and subluxed the humerus posteriorly getting good exposure of the glenoid face.  We removed the capsule, labrum, biceps anchor and once we had glenoid exposed we removed the  remaining cartilage.  We then found our center point for a guide pin placed at low on the glenoid.  We then reamed for the metaglene baseplate, did our peripheral hand reaming and then drilled our central peg hole.  We then impacted the metaglene into  position was an HA coated press fit prosthesis referencing off that 6 o'clock and 12 o'clock position, we placed a 42 screw inferiorly and a 36 superiorly.  Due to small AP diameter of the glenoid we did not place anterior and posterior screws.  We did  lock our superior and inferior screws into the plate.  The baseplate was very secure.  We went ahead and then attached 42+0 standard glenosphere onto the baseplate and we were happy with the position  of that glenosphere.  I did a finger sweep to make  sure no soft tissue was caught up between the baseplate and the glenosphere.  At this point, we went back to the humeral side and reamed for the one left metaphysis.   We then trialled with a 10 stem with a 1 left metaphysis and a 42+3 poly trial and  placed that on the humeral tray, reduced the shoulder and pleased with our soft tissue balancing.  We retrieved the trial components from the humeral side and then used available bone graft with the HA coated press fit 10 stem and a HA coated press fit  #1 left metaphysis set on the 0 setting and impacted in 20 degrees of retroversion with that bone graft using impaction grafting technique.  We had excellent seating of that inlay humeral stem and then we placed a 42+3 real poly on to that tray and  impacted that reduced the shoulder again nice little pop as it reduced.  Appropriate conjoined tensioning, no gapping with external rotation or inferior pole.  We then irrigated it thoroughly and repaired the subscapularis anatomically back to bone.  We  had drilled holes in lesser tuberosity and placed #2 FiberWire suture through this hole so we could have a nice bony repair. The subscap repair went well and there was no relative gapping with full range of motion of the shoulder and no impingement.  We  irrigated thoroughly.  We then repaired deltopectoral interval with 0 Vicryl suture followed by 2-0 Vicryl for subcutaneous closure and 4-0 Monocryl for skin.  Steri-Strips applied followed by sterile dressing.  The patient tolerated surgery well.   PUS D: 03/13/2022 12:03:39 pm T: 03/13/2022 1:21:00 pm  JOB: [id number]/ [id number]

## 2022-03-13 NOTE — Anesthesia Postprocedure Evaluation (Signed)
Anesthesia Post Note  Patient: Dale Glover.  Procedure(s) Performed: Left REVERSE total SHOULDER ARTHROPLASTY (Left: Shoulder)     Patient location during evaluation: PACU Anesthesia Type: General Level of consciousness: awake and alert Pain management: pain level controlled Vital Signs Assessment: post-procedure vital signs reviewed and stable Respiratory status: spontaneous breathing, nonlabored ventilation, respiratory function stable and patient connected to nasal cannula oxygen Cardiovascular status: blood pressure returned to baseline and stable Postop Assessment: no apparent nausea or vomiting Anesthetic complications: no   No notable events documented.  Last Vitals:  Vitals:   03/13/22 1300 03/13/22 1310  BP: 103/79 122/77  Pulse: 66 63  Resp: 11 14  Temp: 36.7 C 36.7 C  SpO2: 96% 94%    Last Pain:  Vitals:   03/13/22 1310  TempSrc:   PainSc: 0-No pain                 Barnet Glasgow

## 2022-03-13 NOTE — Discharge Instructions (Signed)
Ice to the shoulder constantly.  Keep the incision covered and clean and dry for one week, then ok to get it wet in the shower.  Do exercise as instructed several times per day.  DO NOT reach behind your back or push up out of a chair with the operative arm. Keep it light and in front of you!!!! If in doubt DONT  Use a sling while you are up and around for comfort, may remove while seated.  Keep pillow propped behind the operative elbow.  Follow up with Dr Veverly Fells in two weeks in the office, call [phone] for appt   Call Dr Veverly Fells at [phone] cell with any questions or concerns

## 2022-03-13 NOTE — Brief Op Note (Signed)
03/13/2022  11:57 AM  PATIENT:  Dale Glover.  71 y.o. male  PRE-OPERATIVE DIAGNOSIS:  left shoulder rotator cuff tear, massive, irrepairable  POST-OPERATIVE DIAGNOSIS:  left shoulder rotator cuff tear, massive,  irrepairable  PROCEDURE:  Procedure(s) with comments: Left REVERSE total SHOULDER ARTHROPLASTY (Left) - choice and interscalene block DePuy Delta Xtend with Subscap repair  SURGEON:  Surgeon(s) and Role:    Netta Cedars, MD - Primary  PHYSICIAN ASSISTANT:   ASSISTANTS: [place] Bruns, PA-C   ANESTHESIA:   regional and general  EBL:  200 mL   BLOOD ADMINISTERED:none  DRAINS: none   LOCAL MEDICATIONS USED:  MARCAINE     SPECIMEN:  No Specimen  DISPOSITION OF SPECIMEN:  N/A  COUNTS:  YES  TOURNIQUET:  * No tourniquets in log *  DICTATION: .Other Dictation: Dictation Number [id number]  PLAN OF CARE: Discharge to home after PACU  PATIENT DISPOSITION:  PACU - hemodynamically stable.   Delay start of Pharmacological VTE agent (>24hrs) due to surgical blood loss or risk of bleeding: not applicable

## 2022-03-13 NOTE — Evaluation (Signed)
Occupational Therapy Evaluation Patient Details Name: Dale Glover. MRN: [id number] DOB: [birth year] Today's Date: 03/13/2022   History of Present Illness 71 year old male now s/p Left reverse total shoulder arthroplasty using DePuy Delta Xtend prosthesis with subscapularis repair on 03/03/22.   Clinical Impression   Pt is a 71 year old male, s/pLT reverse shoulder replacement without functional use of Left non-dominant upper extremity secondary to effects of surgery and interscalene block and shoulder precautions. Therapist provided education and instruction to patient and spouse in regards to exercises, precautions, positioning, donning upper extremity clothing and bathing while maintaining shoulder precautions, ice and edema management and donning/doffing sling. Patient and spouse verbalized understanding and demonstrated as needed. Patient needed assistance to donn shirt, underwear, pants, socks and shoes and provided with instruction on compensatory strategies to perform ADLs. Noted pt without waterproof bandage and was educated not to shower until cleared by Psychologist, sport and exercise. Patient limited by decreased ROM in Left shoulder so therefore will need some form of assistance at home. Wife is a retired Development worker, international aid. Patient and spouse verbalized and/or demonstrated understanding to all instruction. Patient to follow up with MD for further therapy needs.        Recommendations for follow up therapy are one component of a multi-disciplinary discharge planning process, led by the attending physician.  Recommendations may be updated based on patient status, additional functional criteria and insurance authorization.   Follow Up Recommendations  Follow physician's recommendations for discharge plan and follow up therapies    Assistance Recommended at Discharge Intermittent Supervision/Assistance  Patient can return home with the following A little help with bathing/dressing/bathroom;Assistance  with cooking/housework;Assist for transportation    Functional Status Assessment  Patient has had a recent decline in their functional status and demonstrates the ability to make significant improvements in function in a reasonable and predictable amount of time.  Equipment Recommendations  None recommended by OT    Recommendations for Other Services       Precautions / Restrictions Precautions Precautions: Shoulder Type of Shoulder Precautions: Sling at all times except ADL/exercise Yes  Non weight bearing Yes  AROM elbow, wrist and hand to tolerance Yes  PROM of shoulder No  AROM of shoulder No  OT Consult Special Instructions GENTLE ADLS ONLY HAND TO FACE Shoulder Interventions: Shoulder sling/immobilizer;At all times;Off for dressing/bathing/exercises Precaution Booklet Issued: Yes (comment) Required Braces or Orthoses: Sling Restrictions Weight Bearing Restrictions: Yes LUE Weight Bearing: Non weight bearing      Mobility Bed Mobility               General bed mobility comments: Received in Recliner    Transfers Overall transfer level: Modified independent                        Balance Overall balance assessment: Modified Independent, History of Falls                                         ADL either performed or assessed with clinical judgement   ADL Overall ADL's : Needs assistance/impaired Eating/Feeding: Set up   Grooming: Modified independent   Upper Body Bathing: Minimal assistance;Adhering to UE precautions;With caregiver independent assisting;Cueing for compensatory techniques   Lower Body Bathing: Min guard;Sit to/from stand   Upper Body Dressing : Moderate assistance;Adhering to UE precautions;With caregiver independent assisting;Cueing for compensatory techniques  Lower Body Dressing: Minimal assistance;With caregiver independent assisting;Sitting/lateral leans;Sit to/from stand   Toilet Transfer: Modified  Programmer, applications Details (indicate cue type and reason): Pt stood slowly from recliner Mod I. Toileting- Water quality scientist and Hygiene: Min guard;Minimal assistance Toileting - Clothing Manipulation Details (indicate cue type and reason): Minimal assist needed to manage clothing behind LT shoulder Tub/ Shower Transfer: Supervision/safety   Functional mobility during ADLs: Modified independent;Caregiver able to provide necessary level of assistance       Vision Baseline Vision/History: 1 Wears glasses Vision Assessment?: No apparent visual deficits     Perception     Praxis      Pertinent Vitals/Pain Pain Assessment Pain Assessment: No/denies pain (Numb)     Hand Dominance Right   Extremity/Trunk Assessment Upper Extremity Assessment Upper Extremity Assessment: LUE deficits/detail;RUE deficits/detail RUE Deficits / Details: WFL RUE Coordination: WNL LUE: Unable to fully assess due to immobilization LUE Sensation: decreased light touch   Lower Extremity Assessment Lower Extremity Assessment: Overall WFL for tasks assessed   Cervical / Trunk Assessment Cervical / Trunk Assessment: Normal   Communication Communication Communication: No difficulties   Cognition Arousal/Alertness: Awake/alert Behavior During Therapy: WFL for tasks assessed/performed Overall Cognitive Status: Within Functional Limits for tasks assessed                                       General Comments       Exercises Other Exercises Other Exercises: Pt able to demonstrate hand and wrist ROm exercises. Unable at forearm and elbow due to post-op numbness. OT demonstrated and pt/spouse verbalized understanding.   Shoulder Instructions Shoulder Instructions Donning/doffing shirt without moving shoulder: Moderate assistance;Patient able to independently direct caregiver;Caregiver independent with task Method for sponge bathing under operated UE: Caregiver independent  with task;Patient able to independently direct caregiver;Minimal assistance Donning/doffing sling/immobilizer: Moderate assistance;Patient able to independently direct caregiver;Caregiver independent with task Correct positioning of sling/immobilizer: Independent;Caregiver independent with task;Patient able to independently direct caregiver ROM for elbow, wrist and digits of operated UE: Independent;Caregiver independent with task;Patient able to independently direct caregiver Sling wearing schedule (on at all times/off for ADL's): Independent;Caregiver independent with task;Patient able to independently direct caregiver Proper positioning of operated UE when showering: Caregiver independent with task;Patient able to independently direct caregiver;Modified independent Dressing change: Maximal assistance;Patient able to independently direct caregiver;Caregiver independent with task Positioning of UE while sleeping: Caregiver independent with task;Patient able to independently direct caregiver;[place] expects to be discharged to:: Private residence Living Arrangements: Spouse/significant other Available Help at Discharge: Available 24 hours/day Type of Home: House Home Access: Stairs to enter [employer] of Steps: 2   Home Layout: Two level;Full bath on main level;Able to live on main level with bedroom/bathroom     Bathroom Shower/Tub: Occupational psychologist: Handicapped height (Sink on the RT)     Home Equipment: Grab bars - tub/shower;Shower seat - built in;Hand held Veterinary surgeon - single point;Rollator (4 wheels)          Prior Functioning/Environment Prior Level of Function : Independent/Modified Independent;History of Falls (last six months);Driving             Mobility Comments: Independent ADLs Comments: Independent        OT Problem List: Decreased range of motion;Decreased strength      OT  Treatment/Interventions:      OT Goals(Current goals can  be found in the care plan section) Acute Rehab OT Goals Patient Stated Goal: Home today OT Goal Formulation: All assessment and education complete, DC therapy Potential to Achieve Goals: Good ADL Goals Additional ADL Goal #1: Pt and/or spouse will demonstrate UE/LE dressing, donning/doffing of sling, correct positioning of LT UE, and compensatory strategies for LT axilla hygiene, all while correctly following all shoulder post-op precautions/restrictions.  OT Frequency:      Co-evaluation              AM-PAC OT "6 Clicks" Daily Activity     Outcome Measure Help from another person eating meals?: None Help from another person taking care of personal grooming?: None Help from another person toileting, which includes using toliet, bedpan, or urinal?: A Little Help from another person bathing (including washing, rinsing, drying)?: A Little Help from another person to put on and taking off regular upper body clothing?: A Lot Help from another person to put on and taking off regular lower body clothing?: A Little 6 Click Score: 19   End of Session Equipment Utilized During Treatment: Other (comment) (Sling) Nurse Communication: Other (comment) (OT completed. Pt good to discharge from OT standpoint)  Activity Tolerance: Patient tolerated treatment well Patient left: in chair;with family/visitor present  OT Visit Diagnosis: Muscle weakness (generalized) (M62.81);History of falling (Z91.81)                Time: 1400-1435 OT Time Calculation (min): 35 min Charges:  OT Evaluation $OT Eval Low Complexity: 1 Low OT Treatments $Self Care/Home Management : 8-22 mins  Anderson Malta, OT Acute Rehab Services Office: [phone] 03/13/2022 Julien Girt 03/13/2022, 2:11 PM

## 2022-03-13 NOTE — Interval H&P Note (Signed)
History and Physical Interval Note:  03/13/2022 9:24 AM  Dale Glover.  has presented today for surgery, with the diagnosis of left shoulder rotator cuff tear.  The various methods of treatment have been discussed with the patient and family. After consideration of risks, benefits and other options for treatment, the patient has consented to  Procedure(s) with comments: Left REVERSE total SHOULDER ARTHROPLASTY (Left) - choice and interscalene block as a surgical intervention.  The patient's history has been reviewed, patient examined, no change in status, stable for surgery.  I have reviewed the patient's chart and labs.  Questions were answered to the patient's satisfaction.     Augustin Schooling

## 2022-03-13 NOTE — Anesthesia Procedure Notes (Signed)
  Anesthesia Regional Block: Interscalene brachial plexus block   Pre-Anesthetic Checklist: , timeout performed,  Correct Patient, Correct Site, Correct Laterality,  Correct Procedure, Correct Position, site marked,  Risks and benefits discussed,  Surgical consent,  Pre-op evaluation,  At surgeon's request and post-op pain management  Laterality: Upper  Prep: Maximum Sterile Barrier Precautions used, chloraprep       Needles:  Injection technique: Single-shot  Needle Type: Echogenic Needle     Needle Length: 5cm  Needle Gauge: 21     Additional Needles:   Procedures:,,,, ultrasound used (permanent image in chart),,    Narrative:  Start time: 03/13/2022 9:19 AM End time: 03/13/2022 9:27 AM Injection made incrementally with aspirations every 5 mL.  Performed by: Personally  Anesthesiologist: Barnet Glasgow, MD  Additional Notes: Block assessed prior to procedure. Patient tolerated procedure well.

## 2022-03-14 ENCOUNTER — Encounter (HOSPITAL_COMMUNITY): Payer: Self-pay | Admitting: Orthopedic Surgery
